# Patient Record
Sex: Female | Born: 1985 | Race: White | Hispanic: No | Marital: Married | Smoking: Former smoker
Health system: Southern US, Community
[De-identification: ages and names within clinical notes are randomized; demographics above are authoritative.]

## PROBLEM LIST (undated history)

## (undated) ENCOUNTER — Inpatient Hospital Stay (HOSPITAL_COMMUNITY): Payer: Self-pay

## (undated) DIAGNOSIS — Z9851 Tubal ligation status: Secondary | ICD-10-CM

## (undated) DIAGNOSIS — D62 Acute posthemorrhagic anemia: Secondary | ICD-10-CM

## (undated) DIAGNOSIS — O4400 Placenta previa specified as without hemorrhage, unspecified trimester: Secondary | ICD-10-CM

## (undated) HISTORY — PX: MUSCLE REPAIR: SHX867

---
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
## 2006-03-26 ENCOUNTER — Emergency Department (HOSPITAL_COMMUNITY): Admission: EM | Admit: 2006-03-26 | Discharge: 2006-03-26 | Payer: Self-pay | Admitting: Emergency Medicine

## 2007-06-01 IMAGING — CT CT ABDOMEN W/ CM
2 of 5 series · 13 of 32 positions shown, 18 images · IV contrast (ORAL OMNI 350 & 80 ml omni 300)
Comparison: None.

CLINICAL DATA: Generalized abdominal pain with nausea, vomiting and diarrhea since 7 PM last night. Rule out food poisoning.
ABDOMEN CT WITH CONTRAST:
TECHNIQUE: Multidetector CT imaging of the abdomen was performed following the standard protocol during bolus administration of intravenous contrast.
Contrast:  80 cc Omnipaque 300
TECHNIQUE: Multidetector CT imaging of the pelvis was performed following the standard protocol during bolus administration of intravenous contrast.

[Series 2: routine abdomen · axial · 0.66mm/px · z∈[-392,-112]mm · 5 of 86 slices shown, 10 images]
[im 15/86  soft-tissue]
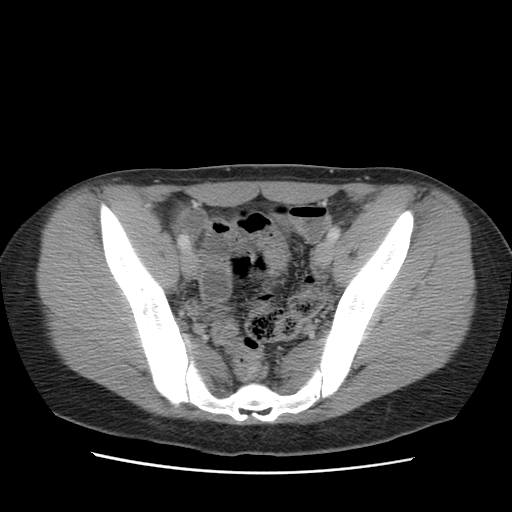
[im 15/86  bone]
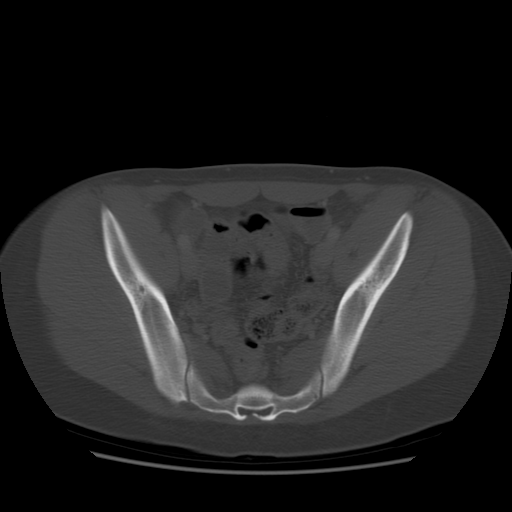
[im 29/86  soft-tissue]
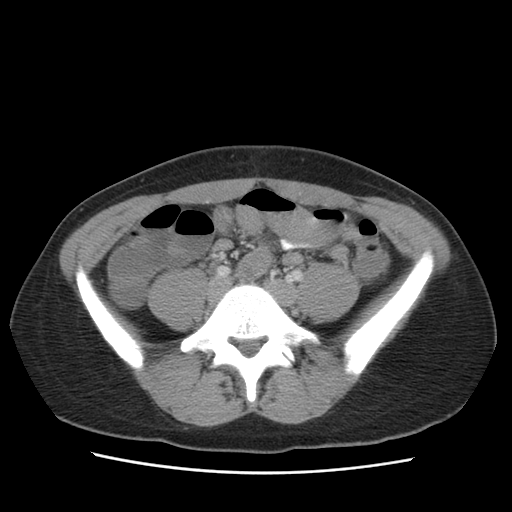
[im 29/86  lung]
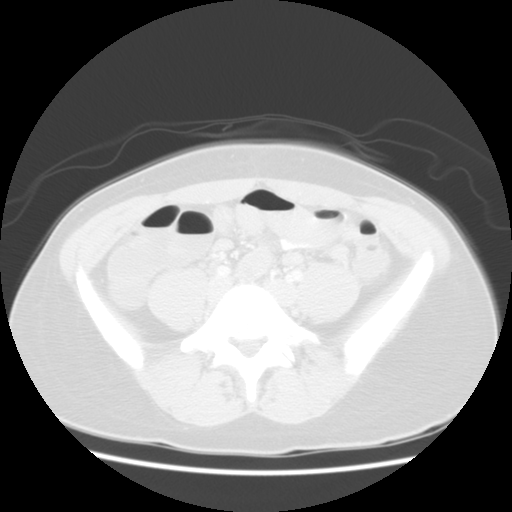
[im 43/86  soft-tissue]
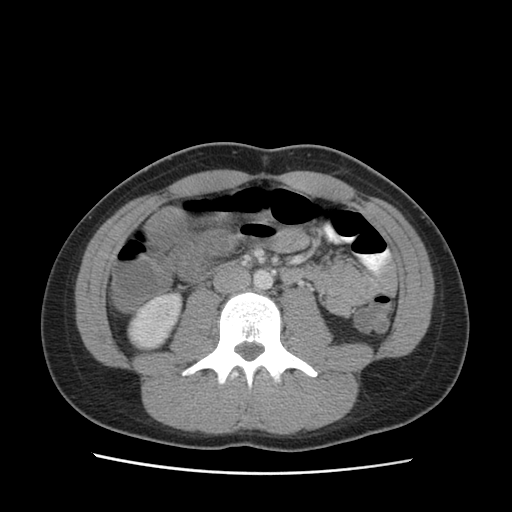
[im 43/86  lung]
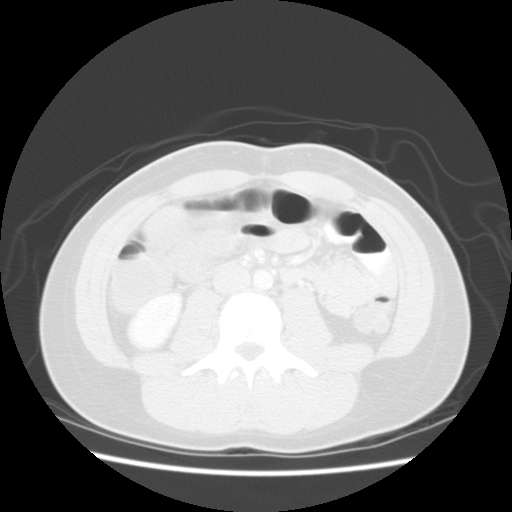
[im 57/86  soft-tissue]
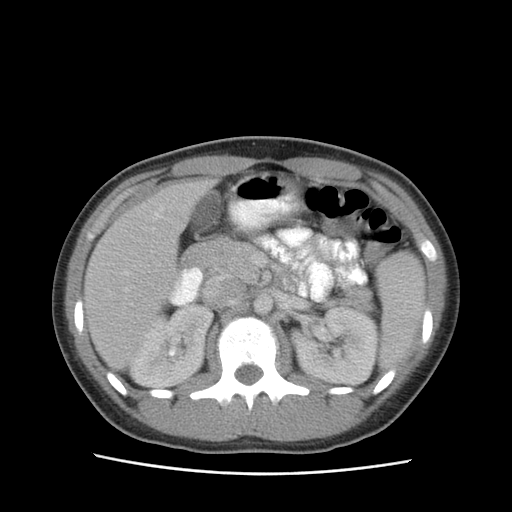
[im 57/86  lung]
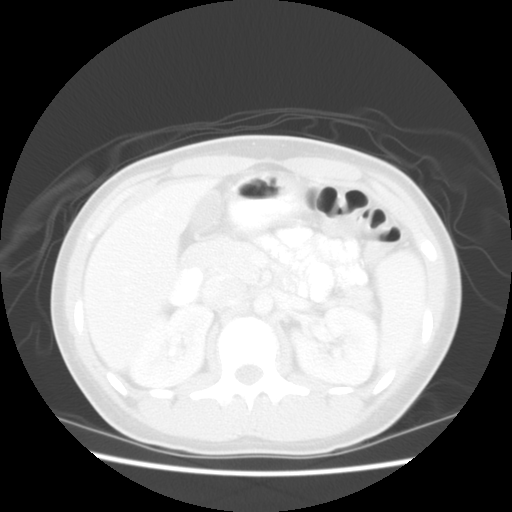
[im 71/86  soft-tissue]
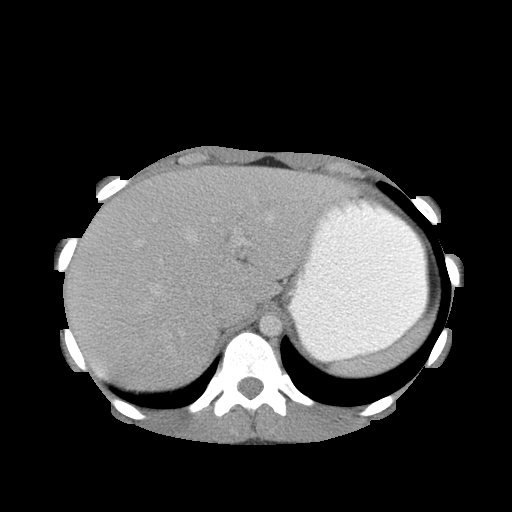
[im 71/86  lung]
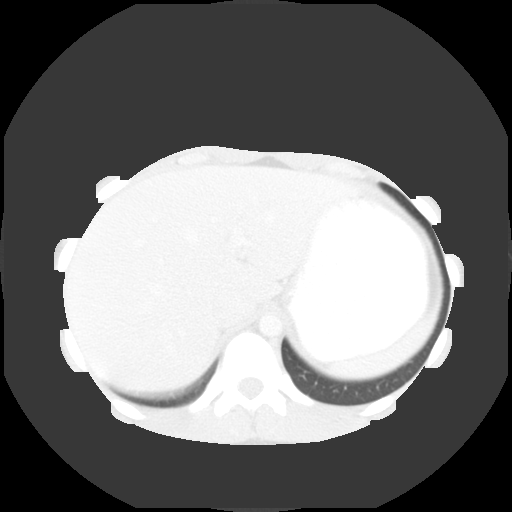

[Series 400: reformatted · sagittal · 0.94mm/px · 8 of 127 slices shown]
[im 13/127  soft-tissue]
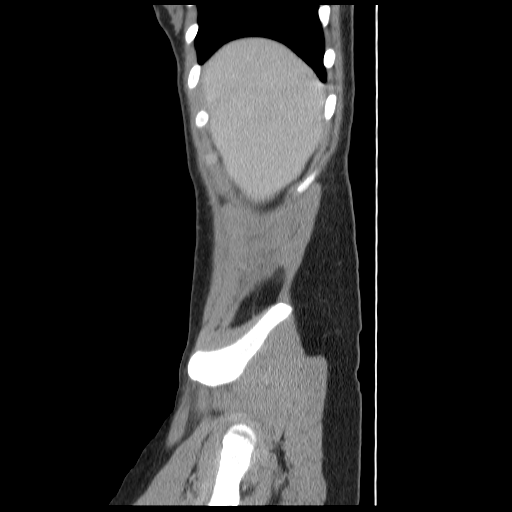
[im 26/127  soft-tissue]
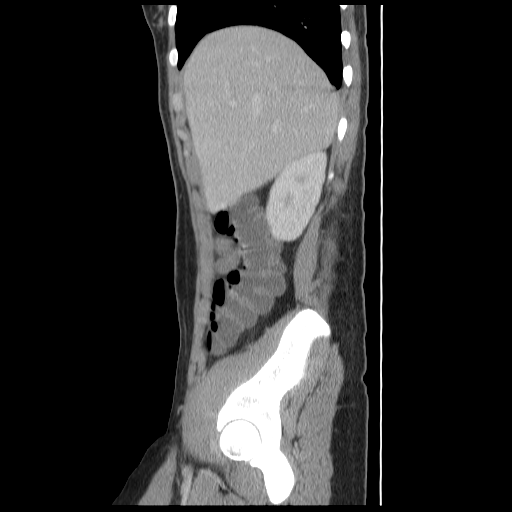
[im 38/127  soft-tissue]
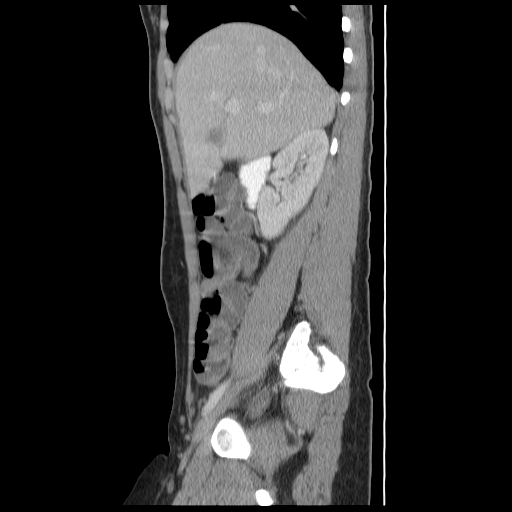
[im 51/127  soft-tissue]
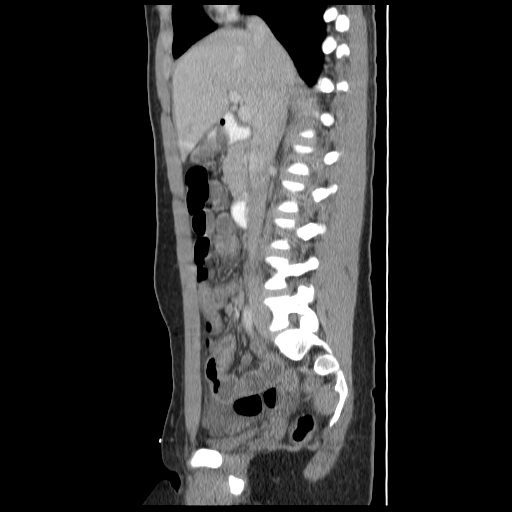
[im 76/127  soft-tissue]
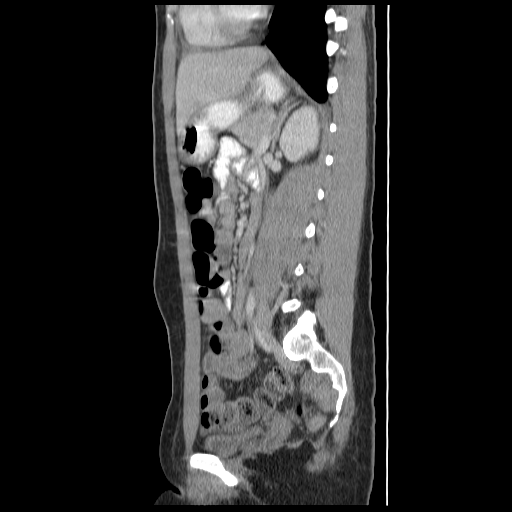
[im 89/127  soft-tissue]
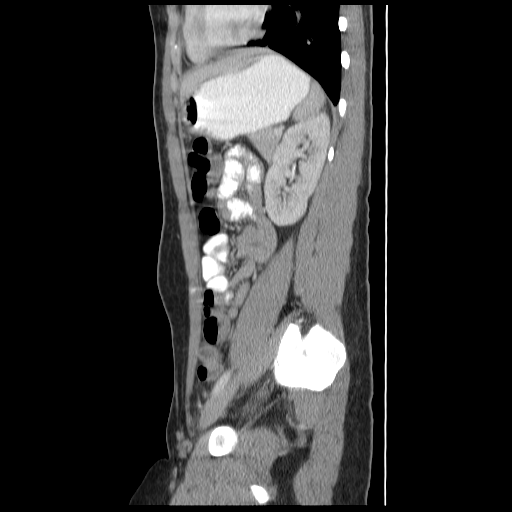
[im 101/127  soft-tissue]
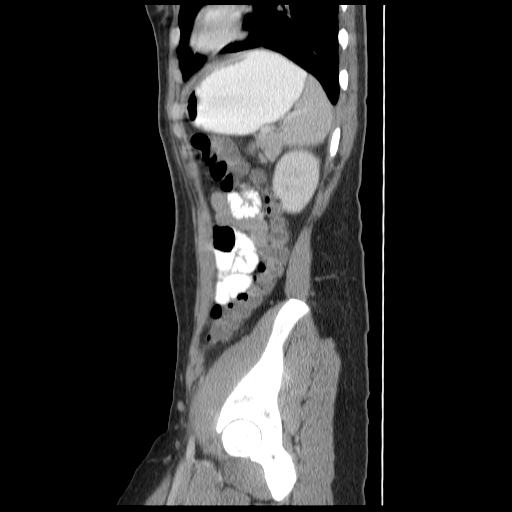
[im 114/127  soft-tissue]
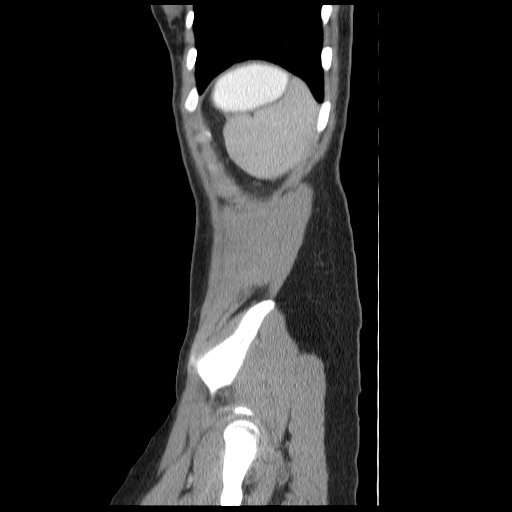

[13 of 32 positions shown; findings below may reference images not displayed]

FINDINGS: Lung bases are clear.  Heart size is normal.  There is no pericardial or pleural effusion. 
The liver, spleen, stomach, pancreas, gallbladder, adrenal glands, and kidneys are normal.  There is no retroperitoneal or retrocrural adenopathy.  
There is poor opacification of distal small bowel loops in the colon.  There is, however, an area of apparent ileal wall thickening on image 45 through 51 with surrounding mild edema. The appendix is difficult to confidently localize.  It is felt to be positioned in the right hemipelvis on images 67 through 72.  There is air within the appendix. There is no convincing evidence of appendicitis. No obstruction.
IMPRESSION: 1.  An area of mild ileal wall thickening superimposed upon under distention is suspicious for ileitis.  This is suboptimally evaluated due to poor opacification in this area.  Differential considerations would include most likely an infectious ileitis with an inflammatory ileitis felt less likely.  The terminal ileum is within normal limits.
2.  Suboptimal evaluation of the appendix without convincing evidence of appendicitis.  Consideration of repeat imaging after further enteric contrast opacification of the distal small bowel is recommended.
PELVIS CT WITH CONTRAST:
FINDINGS: Please see discussion above of the appendix.  There is a small amount of free fluid.  Pelvic small bowel is normal. There is no pelvic adenopathy. The urinary bladder, uterus are normal for age. Bone windows demonstrate no focal osseous lesion.
IMPRESSION: Small amount of free fluid within the pelvis.

## 2012-07-17 NOTE — L&D Delivery Note (Signed)
Delivery Note At 4:50 PM a healthy female was delivered via  (Presentation: Occiput left ant ).  APGAR: 9, 9 ; weight pending.   Placenta status: complete , 3 Vs .  Cord:  with the following complications: none .  Cord pH: N/A  Anesthesia:  epidural Episiotomy: none Lacerations: 2nd degree perineal and right labia minora Suture Repair: 2.0 3.0 vicryl vicryl rapide 4-0 Est. Blood Loss (mL): 300  Mom to postpartum.  Baby to nursery-stable.  Shloma Roggenkamp,MARIE-LYNE 04/22/2013, 5:22 PM

## 2012-09-23 LAB — OB RESULTS CONSOLE ANTIBODY SCREEN: Antibody Screen: NEGATIVE

## 2012-09-23 LAB — OB RESULTS CONSOLE GBS: GBS: POSITIVE

## 2012-09-23 LAB — OB RESULTS CONSOLE RPR: RPR: NONREACTIVE

## 2012-09-24 LAB — OB RESULTS CONSOLE GC/CHLAMYDIA
Chlamydia: NEGATIVE
Gonorrhea: NEGATIVE

## 2013-04-22 ENCOUNTER — Inpatient Hospital Stay (HOSPITAL_COMMUNITY)
Admission: AD | Admit: 2013-04-22 | Discharge: 2013-04-24 | DRG: 373 | Disposition: A | Discharge status: Home / Self Care | Payer: BC Managed Care – PPO | Source: Ambulatory Visit | Attending: Obstetrics & Gynecology | Admitting: Obstetrics & Gynecology

## 2013-04-22 ENCOUNTER — Encounter (HOSPITAL_COMMUNITY): Payer: Self-pay | Admitting: Anesthesiology

## 2013-04-22 ENCOUNTER — Inpatient Hospital Stay (HOSPITAL_COMMUNITY): Payer: BC Managed Care – PPO | Admitting: Anesthesiology

## 2013-04-22 ENCOUNTER — Encounter (HOSPITAL_COMMUNITY): Payer: Self-pay | Admitting: *Deleted

## 2013-04-22 DIAGNOSIS — O99892 Other specified diseases and conditions complicating childbirth: Principal | ICD-10-CM | POA: Diagnosis present

## 2013-04-22 DIAGNOSIS — Z22330 Carrier of Group B streptococcus: Secondary | ICD-10-CM

## 2013-04-22 LAB — CBC
MCHC: 35.2 g/dL (ref 30.0–36.0)
MCV: 85.9 fL (ref 78.0–100.0)
Platelets: 214 10*3/uL (ref 150–400)
RBC: 3.84 MIL/uL — ABNORMAL LOW (ref 3.87–5.11)
RDW: 13 % (ref 11.5–15.5)
WBC: 10 10*3/uL (ref 4.0–10.5)

## 2013-04-22 LAB — RPR: RPR Ser Ql: NONREACTIVE

## 2013-04-22 MED ORDER — CITRIC ACID-SODIUM CITRATE 334-500 MG/5ML PO SOLN: 30.0000 mL | ORAL | Status: DC | PRN

## 2013-04-22 MED ORDER — DIBUCAINE 1 % RE OINT: 1.0000 "application " | TOPICAL_OINTMENT | RECTAL | Status: DC | PRN

## 2013-04-22 MED ORDER — EPHEDRINE 5 MG/ML INJ
10.0000 mg | INTRAVENOUS | Status: DC | PRN
  Filled 2013-04-22: qty 2
  Filled 2013-04-22: qty 4

## 2013-04-22 MED ORDER — EPHEDRINE 5 MG/ML INJ
10.0000 mg | INTRAVENOUS | Status: DC | PRN
  Filled 2013-04-22: qty 2

## 2013-04-22 MED ORDER — IBUPROFEN 600 MG PO TABS: 600.0000 mg | ORAL_TABLET | Freq: Four times a day (QID) | ORAL | Status: DC | PRN

## 2013-04-22 MED ORDER — ACETAMINOPHEN 325 MG PO TABS
650.0000 mg | ORAL_TABLET | ORAL | Status: DC | PRN
  Administered 2013-04-22: 650 mg via ORAL
  Filled 2013-04-22: qty 2

## 2013-04-22 MED ORDER — LACTATED RINGERS IV SOLN
INTRAVENOUS | Status: DC
  Administered 2013-04-22: 06:00:00 via INTRAVENOUS

## 2013-04-22 MED ORDER — BUTORPHANOL TARTRATE 1 MG/ML IJ SOLN: 1.0000 mg | INTRAMUSCULAR | Status: DC | PRN

## 2013-04-22 MED ORDER — ONDANSETRON HCL 4 MG/2ML IJ SOLN
4.0000 mg | Freq: Four times a day (QID) | INTRAMUSCULAR | Status: DC | PRN
  Administered 2013-04-22: 4 mg via INTRAVENOUS
  Filled 2013-04-22: qty 2

## 2013-04-22 MED ORDER — OXYTOCIN 40 UNITS IN LACTATED RINGERS INFUSION - SIMPLE MED
1.0000 m[IU]/min | INTRAVENOUS | Status: DC
  Administered 2013-04-22: 6 m[IU]/min via INTRAVENOUS
  Administered 2013-04-22: 2 m[IU]/min via INTRAVENOUS

## 2013-04-22 MED ORDER — OXYTOCIN 40 UNITS IN LACTATED RINGERS INFUSION - SIMPLE MED: 62.5000 mL/h | INTRAVENOUS | Status: DC | PRN

## 2013-04-22 MED ORDER — LIDOCAINE HCL (PF) 1 % IJ SOLN
INTRAMUSCULAR | Status: DC | PRN
  Administered 2013-04-22 (×2): 5 mL

## 2013-04-22 MED ORDER — OXYCODONE-ACETAMINOPHEN 5-325 MG PO TABS: 1.0000 | ORAL_TABLET | ORAL | Status: DC | PRN

## 2013-04-22 MED ORDER — OXYCODONE-ACETAMINOPHEN 5-325 MG PO TABS
1.0000 | ORAL_TABLET | ORAL | Status: DC | PRN
  Administered 2013-04-22 – 2013-04-24 (×3): 1 via ORAL
  Filled 2013-04-22 (×3): qty 1

## 2013-04-22 MED ORDER — CITRIC ACID-SODIUM CITRATE 334-500 MG/5ML PO SOLN
30.0000 mL | ORAL | Status: DC | PRN
  Administered 2013-04-22: 30 mL via ORAL
  Filled 2013-04-22: qty 15

## 2013-04-22 MED ORDER — WITCH HAZEL-GLYCERIN EX PADS: 1.0000 "application " | MEDICATED_PAD | CUTANEOUS | Status: DC | PRN

## 2013-04-22 MED ORDER — OXYTOCIN 40 UNITS IN LACTATED RINGERS INFUSION - SIMPLE MED
62.5000 mL/h | INTRAVENOUS | Status: DC
  Administered 2013-04-22: 62.5 mL/h via INTRAVENOUS
  Filled 2013-04-22: qty 1000

## 2013-04-22 MED ORDER — PRENATAL MULTIVITAMIN CH
1.0000 | ORAL_TABLET | Freq: Every day | ORAL | Status: DC
  Administered 2013-04-23: 1 via ORAL

## 2013-04-22 MED ORDER — PHENYLEPHRINE 40 MCG/ML (10ML) SYRINGE FOR IV PUSH (FOR BLOOD PRESSURE SUPPORT)
80.0000 ug | PREFILLED_SYRINGE | INTRAVENOUS | Status: DC | PRN
  Filled 2013-04-22: qty 5
  Filled 2013-04-22: qty 2

## 2013-04-22 MED ORDER — TERBUTALINE SULFATE 1 MG/ML IJ SOLN: 0.2500 mg | Freq: Once | INTRAMUSCULAR | Status: DC | PRN

## 2013-04-22 MED ORDER — ONDANSETRON HCL 4 MG/2ML IJ SOLN: 4.0000 mg | INTRAMUSCULAR | Status: DC | PRN

## 2013-04-22 MED ORDER — IBUPROFEN 600 MG PO TABS
600.0000 mg | ORAL_TABLET | Freq: Four times a day (QID) | ORAL | Status: DC
  Administered 2013-04-23 – 2013-04-24 (×6): 600 mg via ORAL
  Filled 2013-04-22 (×6): qty 1

## 2013-04-22 MED ORDER — LANOLIN HYDROUS EX OINT: TOPICAL_OINTMENT | CUTANEOUS | Status: DC | PRN

## 2013-04-22 MED ORDER — PNEUMOCOCCAL VAC POLYVALENT 25 MCG/0.5ML IJ INJ
0.5000 mL | INJECTION | INTRAMUSCULAR | Status: AC
  Administered 2013-04-24: 0.5 mL via INTRAMUSCULAR
  Filled 2013-04-22: qty 0.5

## 2013-04-22 MED ORDER — PENICILLIN G POTASSIUM 5000000 UNITS IJ SOLR
5.0000 10*6.[IU] | Freq: Once | INTRAVENOUS | Status: AC
  Administered 2013-04-22: 5 10*6.[IU] via INTRAVENOUS
  Filled 2013-04-22: qty 5

## 2013-04-22 MED ORDER — OXYTOCIN BOLUS FROM INFUSION: 500.0000 mL | INTRAVENOUS | Status: DC

## 2013-04-22 MED ORDER — TETANUS-DIPHTH-ACELL PERTUSSIS 5-2.5-18.5 LF-MCG/0.5 IM SUSP: 0.5000 mL | Freq: Once | INTRAMUSCULAR | Status: DC

## 2013-04-22 MED ORDER — ACETAMINOPHEN 325 MG PO TABS: 650.0000 mg | ORAL_TABLET | ORAL | Status: DC | PRN

## 2013-04-22 MED ORDER — DIPHENHYDRAMINE HCL 25 MG PO CAPS: 25.0000 mg | ORAL_CAPSULE | Freq: Four times a day (QID) | ORAL | Status: DC | PRN

## 2013-04-22 MED ORDER — FLEET ENEMA 7-19 GM/118ML RE ENEM: 1.0000 | ENEMA | RECTAL | Status: DC | PRN

## 2013-04-22 MED ORDER — PENICILLIN G POTASSIUM 5000000 UNITS IJ SOLR: 5.0000 10*6.[IU] | Freq: Once | INTRAVENOUS | Status: DC

## 2013-04-22 MED ORDER — LACTATED RINGERS IV SOLN
500.0000 mL | Freq: Once | INTRAVENOUS | Status: AC
  Administered 2013-04-22: 500 mL via INTRAVENOUS

## 2013-04-22 MED ORDER — LACTATED RINGERS IV SOLN: 500.0000 mL | INTRAVENOUS | Status: DC | PRN

## 2013-04-22 MED ORDER — ONDANSETRON HCL 4 MG/2ML IJ SOLN: 4.0000 mg | Freq: Four times a day (QID) | INTRAMUSCULAR | Status: DC | PRN

## 2013-04-22 MED ORDER — ONDANSETRON HCL 4 MG PO TABS: 4.0000 mg | ORAL_TABLET | ORAL | Status: DC | PRN

## 2013-04-22 MED ORDER — PHENYLEPHRINE 40 MCG/ML (10ML) SYRINGE FOR IV PUSH (FOR BLOOD PRESSURE SUPPORT)
80.0000 ug | PREFILLED_SYRINGE | INTRAVENOUS | Status: DC | PRN
  Filled 2013-04-22: qty 2

## 2013-04-22 MED ORDER — BENZOCAINE-MENTHOL 20-0.5 % EX AERO
1.0000 "application " | INHALATION_SPRAY | CUTANEOUS | Status: DC | PRN
  Filled 2013-04-22: qty 56

## 2013-04-22 MED ORDER — LIDOCAINE HCL (PF) 1 % IJ SOLN: 30.0000 mL | INTRAMUSCULAR | Status: DC | PRN

## 2013-04-22 MED ORDER — LACTATED RINGERS IV SOLN
INTRAVENOUS | Status: DC
  Administered 2013-04-22: 03:00:00 via INTRAVENOUS

## 2013-04-22 MED ORDER — FENTANYL 2.5 MCG/ML BUPIVACAINE 1/10 % EPIDURAL INFUSION (WH - ANES)
14.0000 mL/h | INTRAMUSCULAR | Status: DC | PRN
  Administered 2013-04-22 (×2): 14 mL/h via EPIDURAL
  Filled 2013-04-22 (×2): qty 125

## 2013-04-22 MED ORDER — IBUPROFEN 600 MG PO TABS
600.0000 mg | ORAL_TABLET | Freq: Four times a day (QID) | ORAL | Status: DC | PRN
  Administered 2013-04-22: 600 mg via ORAL
  Filled 2013-04-22: qty 1

## 2013-04-22 MED ORDER — DIPHENHYDRAMINE HCL 50 MG/ML IJ SOLN: 12.5000 mg | INTRAMUSCULAR | Status: DC | PRN

## 2013-04-22 MED ORDER — PENICILLIN G POTASSIUM 5000000 UNITS IJ SOLR: 2.5000 10*6.[IU] | INTRAVENOUS | Status: DC

## 2013-04-22 MED ORDER — OXYTOCIN 40 UNITS IN LACTATED RINGERS INFUSION - SIMPLE MED: 62.5000 mL/h | INTRAVENOUS | Status: DC

## 2013-04-22 MED ORDER — SIMETHICONE 80 MG PO CHEW: 80.0000 mg | CHEWABLE_TABLET | ORAL | Status: DC | PRN

## 2013-04-22 MED ORDER — ZOLPIDEM TARTRATE 5 MG PO TABS: 5.0000 mg | ORAL_TABLET | Freq: Every evening | ORAL | Status: DC | PRN

## 2013-04-22 MED ORDER — PENICILLIN G POTASSIUM 5000000 UNITS IJ SOLR
2.5000 10*6.[IU] | INTRAVENOUS | Status: DC
  Administered 2013-04-22 (×2): 2.5 10*6.[IU] via INTRAVENOUS
  Filled 2013-04-22 (×6): qty 2.5

## 2013-04-22 MED ORDER — LIDOCAINE HCL (PF) 1 % IJ SOLN
30.0000 mL | INTRAMUSCULAR | Status: DC | PRN
  Administered 2013-04-22: 30 mL via SUBCUTANEOUS
  Filled 2013-04-22 (×2): qty 30

## 2013-04-22 MED ORDER — SENNOSIDES-DOCUSATE SODIUM 8.6-50 MG PO TABS
2.0000 | ORAL_TABLET | ORAL | Status: DC
  Administered 2013-04-23 – 2013-04-24 (×2): 2 via ORAL
  Filled 2013-04-22: qty 2

## 2013-04-22 NOTE — Progress Notes (Signed)
Delivery of live viable female by Dr Seymour Bars. APGARs 8,9

## 2013-04-22 NOTE — H&P (Signed)
Lillyann Ahart is a 27 y.o. female presenting at 40.4 wks in active labor., UCs+ no leaking/ bleeding. Good FMs. GBS(+), needs PCN in labor.  Maternal Medical History:  Reason for admission: Contractions.    [facility], uncomplicated, good wt gain, nl labs, GBS(+) urine in 1st trim, anatomy sono nl, low placenta resolved in 3rd trim. Took Flu and TDAp vaccines. Quit smoking in 1st trimester.   OB History   Grav Para Term Preterm Abortions TAB SAB Ect Mult Living   1              Past Medical History  Diagnosis Date  . Medical history non-contributory    Past Surgical History  Procedure Laterality Date  . Muscle repair      right groin   Family History: family history includes Cancer in her maternal grandmother, mother, paternal aunt, and paternal grandmother. Social History:  reports that she has been smoking Cigarettes.  She has a 1.5 pack-year smoking history. She does not have any smokeless tobacco history on file. She reports that she does not drink alcohol or use illicit drugs.   Prenatal Transfer Tool  Maternal Diabetes: No Genetic Screening: Normal Ultrascreen neg, AFP1 neg Maternal Ultrasounds/Referrals: Normal Fetal Ultrasounds or other Referrals:  None Maternal Substance Abuse:  No Significant Maternal Medications:  None Significant Maternal Lab Results:  Lab values include: Group B Strep positive Other Comments:  None  ROS neg   Dilation: 4 Effacement (%): 80;70 Station: -2 Exam by:: Robbie Lis, RN Blood pressure 121/68, pulse 89, temperature 97.9 F (36.6 C), temperature source Oral, resp. rate 20, height 5\' 5"  (1.651 m), weight 188 lb (85.276 kg). Exam Physical Exam   A&O x 3, no acute distress. Pleasant HEENT neg, no thyromegaly Lungs CTA bilat CV RRR, S1S2 normal Abdo soft, non tender, non acute Extr no edema/ tenderness Pelvic as above per RN FHT 135-140/ + accels/ no decels/ mod variab- reassuring category I Toco  q 3  min  Prenatal labs: ABO: A(+) Antibody: neg  Rubella:Immune RPR: Neg x 2 HBsAg:  neg HIV: neg  GBS: (+) in urine in 1st trimester Anatomy sono normal  Assessment/Plan: 27 yo, G1 at 40.4 wks in active labor. Start PCN prophylaxis, pain mngmt options reviewed. FHT category I. Monitor progress.    Tamila Gaulin R 04/22/2013, 3:29 AM

## 2013-04-22 NOTE — Progress Notes (Signed)
[name] is a 27 y.o. G1P0 at [redacted]w[redacted]d active labor SROM, clear fluid, epidural working well. FHT category I Per RN  SVE:   Dilation: 6.5 Effacement (%): 80 Station: -1 Exam by:: Teresa Danas RN  Assessment / Plan: Active labor but UCs spaced out, hence labor augmentation with pitocin Labor: Progressing normally Fetal Wellbeing:  Category I Pain Control:  Epidural I/D:  GBS(+) on PCN  Anticipated MOD:  NSVD  [name] R 04/22/2013, 1:27 PM

## 2013-04-22 NOTE — Anesthesia Procedure Notes (Signed)
Epidural Patient location during procedure: OB Start time: 04/22/2013 5:10 AM  Staffing Anesthesiologist: Angus Seller., Harrell Gave. Performed by: anesthesiologist   Preanesthetic Checklist Completed: patient identified, site marked, surgical consent, pre-op evaluation, timeout performed, IV checked, risks and benefits discussed and monitors and equipment checked  Epidural Patient position: sitting Prep: site prepped and draped and DuraPrep Patient monitoring: continuous pulse ox and blood pressure Approach: midline Injection technique: LOR air and LOR saline  Needle:  Needle type: Tuohy  Needle gauge: 17 G Needle length: 9 cm and 9 Needle insertion depth: 5 cm cm Catheter type: closed end flexible Catheter size: 19 Gauge Catheter at skin depth: 10 cm Test dose: negative  Assessment Events: blood not aspirated, injection not painful, no injection resistance, negative IV test and no paresthesia  Additional Notes Patient identified.  Risk benefits discussed including failed block, incomplete pain control, headache, nerve damage, paralysis, blood pressure changes, nausea, vomiting, reactions to medication both toxic or allergic, and postpartum back pain.  Patient expressed understanding and wished to proceed.  All questions were answered.  Sterile technique used throughout procedure and epidural site dressed with sterile barrier dressing. No paresthesia or other complications noted.The patient did not experience any signs of intravascular injection such as tinnitus or metallic taste in mouth nor signs of intrathecal spread such as rapid motor block. Please see nursing notes for vital signs.

## 2013-04-22 NOTE — Anesthesia Preprocedure Evaluation (Signed)

## 2013-04-22 NOTE — MAU Note (Signed)
PT  SAYS HURT  BAD  AT 0030 WITH UC-.   VE IN OFFICE ON 10-2     2-3 CM.    DENIES HSV AND MRSA.

## 2013-04-23 ENCOUNTER — Encounter (HOSPITAL_COMMUNITY): Payer: Self-pay | Admitting: Obstetrics and Gynecology

## 2013-04-23 LAB — CBC
HCT: 29.3 % — ABNORMAL LOW (ref 36.0–46.0)
MCH: 31.3 pg (ref 26.0–34.0)
MCHC: 35.8 g/dL (ref 30.0–36.0)
MCV: 87.5 fL (ref 78.0–100.0)
Platelets: 182 10*3/uL (ref 150–400)
RDW: 13 % (ref 11.5–15.5)
WBC: 16.3 10*3/uL — ABNORMAL HIGH (ref 4.0–10.5)

## 2013-04-23 NOTE — Anesthesia Postprocedure Evaluation (Signed)
Anesthesia Post Note  Patient: [name]  Procedure(s) Performed: * No procedures listed *  Anesthesia type: Epidural  Patient location: Mother/Baby  Post pain: Pain level controlled  Post assessment: Post-op Vital signs reviewed  Last Vitals:  Filed Vitals:   04/23/13 0534  BP: 100/62  Pulse: 62  Temp: 36.5 C  Resp: 18    Post vital signs: Reviewed  Level of consciousness:alert  Complications: No apparent anesthesia complications

## 2013-04-23 NOTE — Progress Notes (Signed)
Patient ID: [name], female   DOB: 04/13/1986, 27 y.o.   MRN: [id number] PPD # 1 SVD  S:  Reports feeling well             Tolerating po/ No nausea or vomiting             Bleeding is light             Pain controlled with ibuprofen (OTC)             Up ad lib / ambulatory / voiding without difficulties.   Newborn  Information for the patient's newborn:  [name], [name] [[id number]]  female  breast feeding - well   O:  A & O x 3, in no apparent distress, very pleasant             VS:  Filed Vitals:   04/22/13 1857 04/22/13 2000 04/22/13 2315 04/23/13 0534  BP: 108/62 133/77 104/55 100/62  Pulse: 54 58 63 62  Temp: 98.1 F (36.7 C) 97.7 F (36.5 C) 98.1 F (36.7 C) 97.7 F (36.5 C)  TempSrc: Oral Oral Oral   Resp: 16 18 18 18   Height:      Weight:      SpO2:  99% 99%     LABS:  Recent Labs  04/22/13 0315 04/23/13 0600  WBC 10.0 16.3*  HGB 11.6* 10.5*  HCT 33.0* 29.3*  PLT 214 182    Blood type: A/Positive/-- (03/10 0000)  Rubella: Immune (03/10 0000)     Lungs: Clear and unlabored  Heart: regular rate and rhythm / no murmurs  Abdomen: soft, non-tender, non-distended, normal bowel sounds             Fundus: firm, non-tender, @ U  Perineum: 2nd degree repair healing well, edema present - ice pack in place  Lochia: light  Extremities: no edema, no calf pain or tenderness, no Homans    A/P: PPD # 1  27 y.o., G1P0   Principal Problem:   Postpartum care following vaginal delivery (10/7) Active Problems:   Normal labor and delivery   Doing well - stable status  Routine post partum orders  Anticipate discharge tomorrow    Raelyn Mora, M, MSN, CNM 04/23/2013, 9:16 AM

## 2013-04-24 ENCOUNTER — Encounter (HOSPITAL_COMMUNITY): Payer: Self-pay | Admitting: *Deleted

## 2013-04-24 MED ORDER — OXYCODONE-ACETAMINOPHEN 5-325 MG PO TABS: 1.0000 | ORAL_TABLET | Freq: Four times a day (QID) | ORAL | Status: DC | PRN

## 2013-04-24 MED ORDER — SENNOSIDES-DOCUSATE SODIUM 8.6-50 MG PO TABS: 2.0000 | ORAL_TABLET | ORAL | Status: DC

## 2013-04-24 MED ORDER — IBUPROFEN 600 MG PO TABS: 600.0000 mg | ORAL_TABLET | Freq: Four times a day (QID) | ORAL | Status: DC | PRN

## 2013-04-24 NOTE — Progress Notes (Signed)
Patient ID: [name], female   DOB: 04-11-1986, 27 y.o.   MRN: [id number] PPD # 2 S/P SVD w/2nd deg lac  Subjective: Pt reports feeling well and eager for d/c home / Pain controlled with ibuprofen and percocet Tolerating po/ Voiding without problems/ No n/v Bleeding is moderate/ Newborn info:  Information for the patient's newborn:  Teresa, Melton [[id number]]  female Feeding: breast    Objective:  VS: Blood pressure 101/62, pulse 54, temperature 98 F (36.7 C), temperature source Oral, resp. rate 18.    Recent Labs  04/22/13 0315 04/23/13 0600  WBC 10.0 16.3*  HGB 11.6* 10.5*  HCT 33.0* 29.3*  PLT 214 182    Blood type: A/Positive Rubella: Immune    Physical Exam:  General:  alert, cooperative and no distress CV: Regular rate and rhythm Resp: clear Abdomen: soft, nontender, normal bowel sounds Uterine Fundus: firm, below umbilicus, nontender Perineum: not inspected Lochia: minimal Ext: edema trace and Homans sign is negative, no sign of DVT    A/P: PPD # 2/ G1P1001/ S/P: SVD w/2nd deg lac Doing well and stable for discharge home RX: Ibuprofen 600mg  po Q 6 hrs prn pain #30 Refill x 1 Percocet 5/325 1 to 2 po Q 4 hrs prn pain #15 No refill Senokot po daily prn constipation #30 ref x 1 WOB/GYN booklet given Routine pp visit in 6wks   Demetrius Revel, MSN, [facility] 04/24/2013, 10:02 AM

## 2013-04-24 NOTE — Discharge Summary (Signed)
Obstetric Discharge Summary Reason for Admission: G1 P0 @ 40wks 4days in active labor.  GBS positive; PCN per protocol Prenatal Procedures: NST and ultrasound Intrapartum Procedures: spontaneous vaginal delivery Postpartum Procedures: none Complications-Operative and Postpartum: 2nd degree perineal laceration Hemoglobin  Date Value Range Status  04/23/2013 10.5* 12.0 - 15.0 g/dL Final     HCT  Date Value Range Status  04/23/2013 29.3* 36.0 - 46.0 % Final    Physical Exam:  General: alert, cooperative and no distress Lochia: appropriate Uterine Fundus: firm Incision: n/a DVT Evaluation: No evidence of DVT seen on physical exam. Negative Homan's sign.  Discharge Diagnoses: G1 P1 s/p SVD with 2nd deg lac  Discharge Information: Date: 04/24/2013 Activity: pelvic rest Diet: routine Medications: PNV, Ibuprofen, Colace and Percocet Condition: stable Instructions: refer to practice specific booklet Discharge to: home Follow-up Information   Follow up with MODY,VAISHALI R, MD In 6 weeks.   Specialty:  Obstetrics and Gynecology   Contact information:   [address] [place] [address] [phone]       Newborn Data: Live born female on 04/22/13 Birth Weight: 8 lb 3 oz (3714 g) APGAR: 8, 9  Home with mother.  Kelliann Pendergraph K 04/24/2013, 10:28 AM

## 2013-11-19 LAB — OB RESULTS CONSOLE RPR: RPR: NONREACTIVE

## 2013-11-19 LAB — OB RESULTS CONSOLE ABO/RH: RH Type: POSITIVE

## 2013-11-19 LAB — OB RESULTS CONSOLE HIV ANTIBODY (ROUTINE TESTING): HIV: NONREACTIVE

## 2013-11-19 LAB — OB RESULTS CONSOLE GC/CHLAMYDIA
Chlamydia: NEGATIVE
Gonorrhea: NEGATIVE

## 2013-11-19 LAB — OB RESULTS CONSOLE RUBELLA ANTIBODY, IGM: RUBELLA: IMMUNE

## 2013-11-19 LAB — OB RESULTS CONSOLE HEPATITIS B SURFACE ANTIGEN: Hepatitis B Surface Ag: NEGATIVE

## 2014-03-02 ENCOUNTER — Inpatient Hospital Stay (HOSPITAL_COMMUNITY): Payer: BC Managed Care – PPO

## 2014-03-02 ENCOUNTER — Inpatient Hospital Stay (HOSPITAL_COMMUNITY)
Admission: AD | Admit: 2014-03-02 | Discharge: 2014-03-07 | DRG: 781 | Disposition: A | Discharge status: Home / Self Care | Payer: BC Managed Care – PPO | Source: Ambulatory Visit | Attending: Obstetrics & Gynecology | Admitting: Obstetrics & Gynecology

## 2014-03-02 ENCOUNTER — Encounter (HOSPITAL_COMMUNITY): Payer: Self-pay | Admitting: *Deleted

## 2014-03-02 DIAGNOSIS — O4413 Placenta previa with hemorrhage, third trimester: Secondary | ICD-10-CM | POA: Diagnosis present

## 2014-03-02 DIAGNOSIS — O99891 Other specified diseases and conditions complicating pregnancy: Secondary | ICD-10-CM | POA: Diagnosis present

## 2014-03-02 DIAGNOSIS — Z22330 Carrier of Group B streptococcus: Secondary | ICD-10-CM

## 2014-03-02 DIAGNOSIS — O99019 Anemia complicating pregnancy, unspecified trimester: Secondary | ICD-10-CM | POA: Diagnosis present

## 2014-03-02 DIAGNOSIS — D649 Anemia, unspecified: Secondary | ICD-10-CM | POA: Diagnosis present

## 2014-03-02 DIAGNOSIS — O4690 Antepartum hemorrhage, unspecified, unspecified trimester: Secondary | ICD-10-CM | POA: Diagnosis present

## 2014-03-02 DIAGNOSIS — O9989 Other specified diseases and conditions complicating pregnancy, childbirth and the puerperium: Secondary | ICD-10-CM

## 2014-03-02 DIAGNOSIS — O4410 Placenta previa with hemorrhage, unspecified trimester: Secondary | ICD-10-CM | POA: Diagnosis present

## 2014-03-02 DIAGNOSIS — O99330 Smoking (tobacco) complicating pregnancy, unspecified trimester: Secondary | ICD-10-CM | POA: Diagnosis present

## 2014-03-02 LAB — TYPE AND SCREEN
ABO/RH(D): A POS
ANTIBODY SCREEN: NEGATIVE

## 2014-03-02 LAB — ABO/RH: ABO/RH(D): A POS

## 2014-03-02 LAB — FIBRINOGEN: Fibrinogen: 379 mg/dL (ref 204–475)

## 2014-03-02 MED ORDER — BETAMETHASONE SOD PHOS & ACET 6 (3-3) MG/ML IJ SUSP
12.0000 mg | Freq: Once | INTRAMUSCULAR | Status: AC
  Administered 2014-03-03: 12 mg via INTRAMUSCULAR
  Filled 2014-03-02: qty 2

## 2014-03-02 MED ORDER — ZOLPIDEM TARTRATE 5 MG PO TABS
5.0000 mg | ORAL_TABLET | Freq: Every evening | ORAL | Status: DC | PRN
  Administered 2014-03-02 – 2014-03-06 (×5): 5 mg via ORAL
  Filled 2014-03-02 (×6): qty 1

## 2014-03-02 MED ORDER — LACTATED RINGERS IV SOLN
INTRAVENOUS | Status: DC
  Administered 2014-03-02 – 2014-03-03 (×4): via INTRAVENOUS

## 2014-03-02 MED ORDER — PRENATAL MULTIVITAMIN CH
1.0000 | ORAL_TABLET | Freq: Every day | ORAL | Status: DC
  Administered 2014-03-03 – 2014-03-06 (×4): 1 via ORAL
  Filled 2014-03-02 (×4): qty 1

## 2014-03-02 MED ORDER — MAGNESIUM SULFATE 40 G IN LACTATED RINGERS - SIMPLE
2.0000 g/h | INTRAVENOUS | Status: DC
  Administered 2014-03-02: 2 g/h via INTRAVENOUS
  Filled 2014-03-02: qty 500

## 2014-03-02 MED ORDER — MAGNESIUM SULFATE BOLUS VIA INFUSION
4.0000 g | Freq: Once | INTRAVENOUS | Status: AC
  Administered 2014-03-02: 4 g via INTRAVENOUS
  Filled 2014-03-02: qty 500

## 2014-03-02 MED ORDER — BETAMETHASONE SOD PHOS & ACET 6 (3-3) MG/ML IJ SUSP
12.0000 mg | INTRAMUSCULAR | Status: DC
  Filled 2014-03-02 (×2): qty 2

## 2014-03-02 MED ORDER — NIFEDIPINE 10 MG PO CAPS
10.0000 mg | ORAL_CAPSULE | Freq: Four times a day (QID) | ORAL | Status: DC
  Administered 2014-03-03: 10 mg via ORAL
  Filled 2014-03-02 (×4): qty 1

## 2014-03-02 MED ORDER — MAGNESIUM SULFATE 40 G IN LACTATED RINGERS - SIMPLE
2.0000 g/h | INTRAVENOUS | Status: AC
  Filled 2014-03-02: qty 500

## 2014-03-02 MED ORDER — DOCUSATE SODIUM 100 MG PO CAPS
100.0000 mg | ORAL_CAPSULE | Freq: Every day | ORAL | Status: DC
  Administered 2014-03-03 – 2014-03-07 (×5): 100 mg via ORAL
  Filled 2014-03-02 (×5): qty 1

## 2014-03-02 MED ORDER — CALCIUM CARBONATE ANTACID 500 MG PO CHEW: 2.0000 | CHEWABLE_TABLET | ORAL | Status: DC | PRN

## 2014-03-02 MED ORDER — ACETAMINOPHEN 325 MG PO TABS: 650.0000 mg | ORAL_TABLET | ORAL | Status: DC | PRN

## 2014-03-02 NOTE — Consult Note (Signed)
MFM Note  Ms. Sharol HarnessSimmons is a 28 year old G2P1 Caucasian female at 27+1 weeks who presented to our unit for consultation and ultrasound. Ms. Sharol HarnessSimmons was admitted last night with heavy vaginal bleeding secondary to a known placenta previa. This was her first bleed. The bleeding had basically stopped by [facility] and currently has some brown discharge. She reports no significant contractions or pain. She denies any other prenatal complications and has been on pelvic rest.  Since admission, she has had no further BR bleeding; has received first dose of BMZ course; on magnesium sulfate  G1: 2014; SVD; 8+3 female; no complications  US today: Partial/marginal anterior placenta previa (placenta ends just past internal os); singleton; no structural abnormalities; normal AFV; EFW at the 60th %tile (2+6); no subchorionic fluid collections ie. hemorrhage  Labs: A+; antibodies: negative; fibrinogen normal  Assessment: 1) SIUP at 27+1 weeks 2) Vaginal bleeding secondary to partial placenta previa 3) Partial previa 4) Received first dose of BMZ  Recommendations:  1) Would observe inhouse for 5-7 days 2) If no further bleeding, then d/c and follow as an outpt (she has family/friend support, cell phone and transportation) 3) Consider discontinuing magnesium sulfate after 24 hours, if delivery is not imminent  4) Complete course of BMZ 5) US for growth in 3 weeks 6) Serial CBCs  Thank you and please call with any concerns or questions. O: [phone]; front desk: [phone]

## 2014-03-02 NOTE — Consult Note (Signed)
[facility]       03/02/2014    8:38 PM  I was called at the request of the patient's obstetrician (Dr. Juliene PinaMody) to speak to this patient due to potential preterm delivery as early as 27 weeks due to placenta previa.  The patient's prenatal course includes partial placenta previa.  She was recently admitted with her first episode of bleeding.  She is 27 1/7 weeks currently.  She is admitted to antenatal unit, and is receiving treatment that includes magnesium sulfate and a course of betamethasone.  The baby is a female.  I reviewed expectations for a baby born at 27+ weeks, including survival, length of stay, morbidities such as respiratory distress, IVH, infection, feeding intolerance, retinopathy for gestations ranging from 27 to 34 weeks.  I described how we provide respiratory and feeding support.  I let mom know that the baby's outlook generally improves the longer she remains undelivered.  I spent 20 minutes reviewing the record, speaking to the patient and her husband, and entering appropriate documentation.  More than 50% of the time was spent face to face with patient.   _____________________ Electronically Signed By: Angelita InglesMcCrae S. Naser Schuld, MD Neonatologist

## 2014-03-02 NOTE — H&P (Signed)
[name] is a 28 y.o. female presenting for Maternal Medical History:  Reason for admission: Vaginal bleeding.    28 yo, G2P1001 at 27 wks, [facility] for bleeding placenta previa in stable condition. Known placenta previa at 18 wks sonogram, patient was maitaining pelvic rest and activity limitations. She has her 1st unprovoked bleeding- copious vaginal bleeding on 8/16 late evening and so she [facility]. Per ED doctor I spoke with, marginal previa noted with documented large bleeding but no active bleeding since admission there, FHT- reassuring and patient was stable for transfer.  [facility] Dr Juliene PinaMody as primary. This was failed contraceptive pregnancy - patient was on Camilla- prog only pill since she was breast feeding, 1st baby SVD Oct'14.  GBS positive, no medical/surg hx.   OB History   Grav Para Term Preterm Abortions TAB SAB Ect Mult Living   1 1 1       1      Past Medical History  Diagnosis Date  . Medical history non-contributory   . Postpartum care following vaginal delivery (10/7) 04/23/2013   Past Surgical History  Procedure Laterality Date  . Muscle repair      right groin  . No past surgeries     Family History: family history includes Cancer in her maternal grandmother, mother, paternal aunt, and paternal grandmother. Social History:  reports that she has been smoking Cigarettes.  She has a 1.5 pack-year smoking history. She does not have any smokeless tobacco history on file. She reports that she does not drink alcohol or use illicit drugs.   Prenatal Transfer Tool  Maternal Diabetes: not tested this pregnancy Genetic Screening: Normal  Maternal Ultrasounds/Referrals: Normal female. Placenta previa.  Fetal Ultrasounds or other Referrals:  None Maternal Substance Abuse:  No Significant Maternal Medications:  None Significant Maternal Lab Results:  Lab values include:  Group B Strep positive Other Comments:  None  ROS bleeding    Temperature 98.9 F (37.2 C), temperature source Oral, resp. rate 18, height 5\' 5"  (1.651 m), weight 179 lb (81.194 kg), unknown if currently breastfeeding. Exam Physical Exam   A&O x 3, no acute distress. Pleasant HEENT neg, no thyromegaly Lungs CTA bilat CV RRR, SS2 normal Abdo soft, non tender, non acute, relaxed soft uterus, BREECH per [facility] sono Extr no edema/ tenderness Pelvic no active bleeding but draining dark blood per vagina FHT  150s/ + accels/ no decels/ mod variab - category I/ reactive Toco irritability noted   Prenatal labs: ABO, Rh:   Antibody:  neg  Rubella:  Immune RPR: NON REACTIVE (10/07 0315)  HBsAg:   neg HIV:   neg GBS:   Positive   Assessment/Plan:  28 yo, G2P1001, at 27 wks with placenta previa and 3rd trimester bleeding Magnesium, BTMZ (1st 12.43 am [facility] Bedrest, IVFluids, T&S, C/section reviewed.  MFM sono and consult in AM, NICU consult.  Risks/ maternal and fetal incl prematurity reviewed GBS positive urine.   Trula Frede R 03/02/2014, 2:19 AM

## 2014-03-02 NOTE — Progress Notes (Signed)
Pt to MFM for consult and ultrasound

## 2014-03-02 NOTE — Progress Notes (Signed)
Ur chart review completed.  

## 2014-03-02 NOTE — Progress Notes (Signed)
Patient ID: [name], female   DOB: 11-Nov-1985, 28 y.o.   MRN: [id number]  Previa admitted with bleeding but no active bleeding since then. MFM sono this morning, report pending. Bedside speculum exam noted slight red blood in the vagina but no active bleeding from the os. Pads checked, minimal bleeding. Continue bed rest, ok with bathroom privileges and start clear liquid diet. Stop magnesium at 24 hrs (2.30 am 8/18) and start Procardia 10mg  q 6 hrs after 6 hrs of stopping magnesium.

## 2014-03-02 NOTE — Plan of Care (Signed)
Problem: Consults Goal: Birthing Suites Patient Information Press F2 to bring up selections list  Outcome: Completed/Met Date Met:  03/02/14  Pt < [redacted] weeks EGA

## 2014-03-02 NOTE — Plan of Care (Signed)
Problem: Consults Goal: Neonatologist Consult Outcome: Progressing Nicu called for consult

## 2014-03-03 LAB — CBC
HCT: 28 % — ABNORMAL LOW (ref 36.0–46.0)
HEMOGLOBIN: 9.7 g/dL — AB (ref 12.0–15.0)
MCH: 30.9 pg (ref 26.0–34.0)
MCHC: 34.6 g/dL (ref 30.0–36.0)
MCV: 89.2 fL (ref 78.0–100.0)
PLATELETS: 224 10*3/uL (ref 150–400)
RBC: 3.14 MIL/uL — ABNORMAL LOW (ref 3.87–5.11)
RDW: 13.2 % (ref 11.5–15.5)
WBC: 9.8 10*3/uL (ref 4.0–10.5)

## 2014-03-03 MED ORDER — SODIUM CHLORIDE 0.9 % IJ SOLN
3.0000 mL | Freq: Two times a day (BID) | INTRAMUSCULAR | Status: DC
  Administered 2014-03-03 – 2014-03-06 (×5): 3 mL via INTRAVENOUS

## 2014-03-03 MED ORDER — FERROUS SULFATE 325 (65 FE) MG PO TABS
325.0000 mg | ORAL_TABLET | Freq: Every day | ORAL | Status: DC
  Administered 2014-03-04 – 2014-03-07 (×4): 325 mg via ORAL
  Filled 2014-03-03 (×4): qty 1

## 2014-03-03 NOTE — Progress Notes (Signed)
HD#2, placenta previa with bleeding  S; Pt notes no active bleeding overnight or this am. Some scant brown d/c w/ wiping today. Good FM, no LOF, no ctx. Had a BM today, some constipation at baseline. Some lightheadedness with hot shower this am.   Events: Mag stopped o/n, Procardia started  O: Filed Vitals:   03/03/14 1208  BP: 105/54  Pulse: 72  Temp:   Resp:    Gen: well appearing, no distress Abd: soft, gravid, NT LE: SCD's in place, ND GU: no staining on pad Toco: none FH: 140's, + accels, no decels, 10 beat var. AGA  CBC    Component Value Date/Time   WBC 9.8 03/03/2014 0758   RBC 3.14* 03/03/2014 0758   HGB 9.7* 03/03/2014 0758   HCT 28.0* 03/03/2014 0758   PLT 224 03/03/2014 0758   MCV 89.2 03/03/2014 0758   MCH 30.9 03/03/2014 0758   MCHC 34.6 03/03/2014 0758   RDW 13.2 03/03/2014 0758    A/P: 28 yo at 27'2 w/ marginal placental previa with bleed yesterday - placenta previa. S/p MFM consult who recommends inpt eval for 5-7 days. S/p NICU consult. S/p 24 hr Mag Sulfate. Now on Procardia, may need to hold doses based on pt's low bp. No current symptoms/ bleeding. OK to saline lock IV, move to q shift and prn fetal testing.  - MOD. D/w pt unclear at this time. If placenta migrates away from os, may still be a candidate for SVD - Desires TL if needing c/s - Anemia. Start iron, continue colace, monitor for constipation - FWB. Reassuring testing.   Teresa Daniel A. 03/03/2014 1:17 PM

## 2014-03-04 NOTE — Progress Notes (Signed)
HD#3, placenta previa with bleeding  S; Pt notes no active bleeding overnight or this am.  Some scant brown d/c w/ wiping today.  Good FM, no LOF, no ctx. Had a BM today, some constipation at baseline. Some lightheadedness with hot shower this am.  Procardia held due to low BP  O: BP 68/38  Pulse 58  Temp(Src) 99.4 F (37.4 C) (Oral)  Resp 18  Ht 5\' 5"  (1.651 m)  Wt 81.194 kg (179 lb)  BMI 29.79 kg/m2   Gen: well appearing, no distress NCAT Lgs: CTA CV: RRR Abd: soft, gravid, NT LE: SCD's in place, NT, no cords Neuro: non focal Skin: intact   GU: no bleeding noted Toco: none FH: 140's, + accels, no decels, 10 beat var. AGA Reassuring NST x 3, no contractions noted  Sono c/w AGA, Breech, nl AFI and anterior marginal previa  CBC    Component Value Date/Time   WBC 9.8 03/03/2014 0758   RBC 3.14* 03/03/2014 0758   HGB 9.7* 03/03/2014 0758   HCT 28.0* 03/03/2014 0758   PLT 224 03/03/2014 0758   MCV 89.2 03/03/2014 0758   MCH 30.9 03/03/2014 0758   MCHC 34.6 03/03/2014 0758   RDW 13.2 03/03/2014 0758    A/P:  1- 27wks 3d w/ marginal placental previa - currently stable, BMZ complete BRP with BRP Continue NST q shift  Desires TL if needing c/s 2- Anemia. iron, continue colace, monitor for constipation - FWB. Reassuring testing.  Remain inpt at this time  Lenoard AdenAAVON,Kana Reimann J 03/04/2014 8:57 AM

## 2014-03-05 LAB — TYPE AND SCREEN
ABO/RH(D): A POS
ANTIBODY SCREEN: NEGATIVE

## 2014-03-05 NOTE — Progress Notes (Signed)
Ur chart review completed.  

## 2014-03-05 NOTE — Progress Notes (Signed)
27 4/7 weeks  Previa S: denies any bleeding for past 1 1/2 to 2 day (+) FM O: VSS Afebrile Lungs clear to A Cor RRR Abd: gravid soft nontender Pelvic' deferred Pad no blood  Tracing: baseline 140 (+) accel 150-160 good variability no ctx   IMP: Previa Hx 3rd trim vaginal bleeding  Now resolved.  BMZ  Complete IUP @ 27 4/7 weeks P) cont inpt. Consider d/c home tomorrow if remains stable

## 2014-03-06 NOTE — Progress Notes (Signed)
HD#5, placenta previa with bleeding  S; Pt notes no active bleeding overnight or this am.  Some scant brown d/c w/ wiping today.  Good FM, no LOF, no ctx. Had a BM today, some constipation at baseline. Some lightheadedness with hot shower this am.  Procardia held due to low BP  O: BP 110/57  Pulse 66  Temp(Src) 98.1 F (36.7 C) (Oral)  Resp 18  Ht 5\' 5"  (1.651 m)  Wt 81.738 kg (180 lb 3.2 oz)  BMI 29.99 kg/m2   Gen: well appearing, no distress NCAT Lgs: CTA CV: RRR Abd: soft, gravid, NT LE: SCD's in place, NT, no cords Neuro: non focal Skin: intact   GU: no bleeding noted Toco: none FH: 140's, + accels, no decels, 10 beat var. AGA Reassuring NST x 3, no contractions noted  Sono c/w AGA, Breech, nl AFI and anterior marginal previa  CBC    Component Value Date/Time   WBC 9.8 03/03/2014 0758   RBC 3.14* 03/03/2014 0758   HGB 9.7* 03/03/2014 0758   HCT 28.0* 03/03/2014 0758   PLT 224 03/03/2014 0758   MCV 89.2 03/03/2014 0758   MCH 30.9 03/03/2014 0758   MCHC 34.6 03/03/2014 0758   RDW 13.2 03/03/2014 0758    A/P:  1- 27wks 5d w/ marginal placental previa - currently stable, BMZ complete BRP with BRP Continue NST q shift  Desires TL if needing c/s 2- Anemia. iron, continue colace, monitor for constipation - FWB. Reassuring testing.  Remain inpt at this time Will dc home if stable per MFM tomorrow Lenoard AdenAAVON,[name] 03/06/2014 3:36 PM

## 2014-03-07 NOTE — Progress Notes (Signed)
Patient teaching completed and reviewed with patient and significant other. Patient provided a teachback with s/s to report, when to return and need for follow up this week in office. Pt was discharged per wheelchair with significant other at side

## 2014-03-07 NOTE — Progress Notes (Signed)
HD#6, placenta previa wit  S; Pt notes no active bleeding overnight or this am.   Good FM, no LOF, no ctx. some constipation at baseline.   Procardia held due to low BP  O: BP 96/38  Pulse 75  Temp(Src) 98.4 F (36.9 C) (Oral)  Resp 20  Ht  (1.651 m)  Wt 81.738 kg (180 lb 3.2 oz)  BMI 29.99 kg/m2   Gen: well appearing, no distress NCAT Lgs: CTA CV: RRR Abd: soft, gravid, NT LE: SCD's in place, NT, no cords Neuro: non focal Skin: intact   GU: no bleeding noted Toco: none FH: 150s, + accels, no decels, 10 beat var. AGA Reassuring NST x 3, no contractions noted  Sono c/w AGA, Breech, nl AFI and anterior marginal previa  CBC    Component Value Date/Time   WBC 9.8 03/03/2014 0758   RBC 3.14* 03/03/2014 0758   HGB 9.7* 03/03/2014 0758   HCT 28.0* 03/03/2014 0758   PLT 224 03/03/2014 0758   MCV 89.2 03/03/2014 0758   MCH 30.9 03/03/2014 0758   MCHC 34.6 03/03/2014 0758   RDW 13.2 03/03/2014 0758    A/P:  1- 27wks 5d w/ marginal placental previa - currently stable, BMZ complete 2- Anemia. iron, continue colace, monitor for constipation - FWB. Reassuring testing.   Will dc home today Precautions given Fu office one week [name] J 03/07/2014 10:17 AM

## 2014-03-28 ENCOUNTER — Inpatient Hospital Stay (HOSPITAL_COMMUNITY): Payer: BC Managed Care – PPO

## 2014-03-28 ENCOUNTER — Encounter (HOSPITAL_COMMUNITY): Payer: Self-pay

## 2014-03-28 ENCOUNTER — Inpatient Hospital Stay (HOSPITAL_COMMUNITY)
Admission: AD | Admit: 2014-03-28 | Discharge: 2014-04-03 | DRG: 781 | Disposition: A | Discharge status: Home / Self Care | Payer: BC Managed Care – PPO | Source: Ambulatory Visit | Attending: Obstetrics and Gynecology | Admitting: Obstetrics and Gynecology

## 2014-03-28 DIAGNOSIS — O322XX0 Maternal care for transverse and oblique lie, not applicable or unspecified: Secondary | ICD-10-CM | POA: Diagnosis present

## 2014-03-28 DIAGNOSIS — Z22330 Carrier of Group B streptococcus: Secondary | ICD-10-CM | POA: Diagnosis not present

## 2014-03-28 DIAGNOSIS — O99019 Anemia complicating pregnancy, unspecified trimester: Secondary | ICD-10-CM | POA: Diagnosis present

## 2014-03-28 DIAGNOSIS — O4400 Placenta previa specified as without hemorrhage, unspecified trimester: Secondary | ICD-10-CM | POA: Diagnosis present

## 2014-03-28 DIAGNOSIS — O4413 Placenta previa with hemorrhage, third trimester: Secondary | ICD-10-CM

## 2014-03-28 DIAGNOSIS — O9989 Other specified diseases and conditions complicating pregnancy, childbirth and the puerperium: Secondary | ICD-10-CM

## 2014-03-28 DIAGNOSIS — K5900 Constipation, unspecified: Secondary | ICD-10-CM | POA: Diagnosis present

## 2014-03-28 DIAGNOSIS — D649 Anemia, unspecified: Secondary | ICD-10-CM | POA: Diagnosis present

## 2014-03-28 DIAGNOSIS — Z87891 Personal history of nicotine dependence: Secondary | ICD-10-CM | POA: Diagnosis not present

## 2014-03-28 DIAGNOSIS — O99891 Other specified diseases and conditions complicating pregnancy: Secondary | ICD-10-CM | POA: Diagnosis present

## 2014-03-28 DIAGNOSIS — O4410 Placenta previa with hemorrhage, unspecified trimester: Secondary | ICD-10-CM | POA: Diagnosis present

## 2014-03-28 HISTORY — DX: Complete placenta previa nos or without hemorrhage, unspecified trimester: O44.00

## 2014-03-28 LAB — CBC
HCT: 31.4 % — ABNORMAL LOW (ref 36.0–46.0)
Hemoglobin: 11.1 g/dL — ABNORMAL LOW (ref 12.0–15.0)
MCH: 30.9 pg (ref 26.0–34.0)
MCHC: 35.4 g/dL (ref 30.0–36.0)
MCV: 87.5 fL (ref 78.0–100.0)
Platelets: 215 10*3/uL (ref 150–400)
RBC: 3.59 MIL/uL — ABNORMAL LOW (ref 3.87–5.11)
RDW: 12.7 % (ref 11.5–15.5)
WBC: 7.5 10*3/uL (ref 4.0–10.5)

## 2014-03-28 LAB — APTT: aPTT: 25 seconds (ref 24–37)

## 2014-03-28 LAB — TYPE AND SCREEN
ABO/RH(D): A POS
Antibody Screen: NEGATIVE

## 2014-03-28 LAB — PROTIME-INR
INR: 0.93 (ref 0.00–1.49)
Prothrombin Time: 12.5 seconds (ref 11.6–15.2)

## 2014-03-28 LAB — FIBRINOGEN: Fibrinogen: 443 mg/dL (ref 204–475)

## 2014-03-28 LAB — D-DIMER, QUANTITATIVE: D-Dimer, Quant: 0.62 ug/mL-FEU — ABNORMAL HIGH (ref 0.00–0.48)

## 2014-03-28 MED ORDER — MAGNESIUM SULFATE 40 G IN LACTATED RINGERS - SIMPLE
2.0000 g/h | INTRAVENOUS | Status: DC
  Administered 2014-03-28: 2 g/h via INTRAVENOUS
  Filled 2014-03-28: qty 500

## 2014-03-28 MED ORDER — DOCUSATE SODIUM 100 MG PO CAPS
100.0000 mg | ORAL_CAPSULE | Freq: Two times a day (BID) | ORAL | Status: DC
  Administered 2014-03-28 – 2014-04-03 (×12): 100 mg via ORAL
  Filled 2014-03-28 (×14): qty 1

## 2014-03-28 MED ORDER — MAGNESIUM SULFATE BOLUS VIA INFUSION
4.0000 g | Freq: Once | INTRAVENOUS | Status: AC
  Administered 2014-03-28: 4 g via INTRAVENOUS
  Filled 2014-03-28: qty 500

## 2014-03-28 MED ORDER — PRENATAL MULTIVITAMIN CH
1.0000 | ORAL_TABLET | Freq: Every day | ORAL | Status: DC
  Administered 2014-03-29 – 2014-04-03 (×6): 1 via ORAL
  Filled 2014-03-28 (×7): qty 1

## 2014-03-28 MED ORDER — LACTATED RINGERS IV SOLN
INTRAVENOUS | Status: DC
Start: 2014-03-28 — End: 2014-04-03
  Administered 2014-03-28: 125 mL/h via INTRAVENOUS
  Administered 2014-03-29: 06:00:00 via INTRAVENOUS

## 2014-03-28 MED ORDER — PRENATAL MULTIVITAMIN CH: 1.0000 | ORAL_TABLET | Freq: Every day | ORAL | Status: DC

## 2014-03-28 MED ORDER — ZOLPIDEM TARTRATE 5 MG PO TABS
5.0000 mg | ORAL_TABLET | Freq: Every evening | ORAL | Status: DC | PRN
  Administered 2014-03-29 – 2014-04-02 (×5): 5 mg via ORAL
  Filled 2014-03-28 (×5): qty 1

## 2014-03-28 MED ORDER — FAMOTIDINE 20 MG PO TABS
20.0000 mg | ORAL_TABLET | Freq: Every day | ORAL | Status: DC
  Administered 2014-03-28 – 2014-04-02 (×6): 20 mg via ORAL
  Filled 2014-03-28 (×6): qty 1

## 2014-03-28 MED ORDER — DIPHENHYDRAMINE HCL 25 MG PO TABS
25.0000 mg | ORAL_TABLET | Freq: Four times a day (QID) | ORAL | Status: DC | PRN
  Filled 2014-03-28: qty 1

## 2014-03-28 MED ORDER — ACETAMINOPHEN 325 MG PO TABS
650.0000 mg | ORAL_TABLET | ORAL | Status: DC | PRN
  Administered 2014-03-29: 650 mg via ORAL
  Filled 2014-03-28: qty 2

## 2014-03-28 NOTE — H&P (Signed)
  OB ADMISSION/ HISTORY & PHYSICAL:  Admission Date: 03/28/2014  6:04 PM  Admit Diagnosis: 30.6 weeks placenta previa / active bleeding - third trimester  [name] is a 28 y.o. female presenting for second placenta bleed with complete previa.  Prenatal History: G2P1001   EDC : 05/31/2014, by Other Basis  Prenatal care at [facility]  Primary Ob Provider: Mody Prenatal course complicated by pregnancy close interval / anemia / previa /   Prenatal Labs: ABO, Rh: --/--/A POS (08/20 0515) Antibody: NEG (08/20 0515) Rubella:   Immune RPR: NON REACTIVE (10/07 0315)  HBsAg:   neg HIV:   NR GTT: NL GBS:   Positive bacturia  Medical / Surgical History :  Past medical history:  Past Medical History  Diagnosis Date  . Medical history non-contributory   . Postpartum care following vaginal delivery (10/7) 04/23/2013  . Placenta previa     Past surgical history:  Past Surgical History  Procedure Laterality Date  . Muscle repair      right groin  . No past surgeries     Family History:  Family History  Problem Relation Age of Onset  . Cancer Mother   . Cancer Paternal Aunt   . Cancer Maternal Grandmother   . Cancer Paternal Grandmother     Social History:  reports that she has quit smoking. Her smoking use included Cigarettes. She has a 1.5 pack-year smoking history. She has never used smokeless tobacco. She reports that she does not drink alcohol or use illicit drugs.  Allergies: Review of patient's allergies indicates no known allergies.    Current Medications at time of admission:  Prior to Admission medications   Medication Sig Start Date End Date Taking? Authorizing Provider  diphenhydrAMINE (BENADRYL) 25 MG tablet Take 25 mg by mouth every 6 (six) hours as needed for allergies.     Historical Provider, MD  Prenatal Vit-Fe Fumarate-FA (PRENATAL MULTIVITAMIN) TABS tablet Take 1 tablet by mouth daily at 12 noon.    Historical Provider, MD  ranitidine  (ZANTAC) 75 MG tablet Take 75 mg by mouth daily as needed for heartburn.    Historical Provider, MD   Review of Systems: Active FM Denies ctx or cramping Brown dc this am  Constipation (+) Active red bleeding ~ 5pm  Physical Exam:  VS: Blood pressure 123/61, temperature 98.1 F (36.7 C), temperature source Oral, resp. rate 16, SpO2 100.00%, not currently breastfeeding.  General: alert and oriented, appears NAD or pain Heart: RRR Lungs: Clear lung fields Abdomen: Gravid, soft and non-tender, non-distended / uterus: gravid / soft - transverse lie Extremities: no edema  FHR: baseline rate 145 / variability moderate / accelerations + / no decelerations TOCO: no ctx or UI  SSE by Dr Cherly Hensen - cervix appears visually closed / 10cc red blood in vault  Assessment: 30.[redacted] weeks gestation Complete placenta PREVIA  Third trimester placenta bleeding episode (2) FHR category 1   Plan:  Admit - antenatal Magnesium neuro  Prophylaxis sono NICU consult (done last visit)  Dr Cherly Hensen notified of admission / plan of care   Marlinda Mike CNM, MSN, [facility] 03/28/2014, 6:40 PM

## 2014-03-28 NOTE — MAU Note (Signed)
Report called to [facility] charge RN. Will call back with room

## 2014-03-28 NOTE — MAU Note (Signed)
Pt states here for vag bleeding. Was brown this am. 45 minutes ago began bleeding heavier and came here for eval. Was on antenatal for 8 days before being discharged to home. Saw red smear at home on pad

## 2014-03-28 NOTE — MAU Provider Note (Signed)
  History     CSN: [id number]  Arrival date and time: 03/28/14 4098  Provider here to see patient @ 1825   Chief Complaint  Patient presents with  . Vaginal Bleeding   HPI Known previa Reports brown dc this am after BM - straining with constipation Onset bright red bleeding ~ 5pm  Past Medical History  Diagnosis Date  . Medical history non-contributory   . Postpartum care following vaginal delivery (10/7) 04/23/2013  . Placenta previa     Past Surgical History  Procedure Laterality Date  . Muscle repair      right groin  . No past surgeries      Family History  Problem Relation Age of Onset  . Cancer Mother   . Cancer Paternal Aunt   . Cancer Maternal Grandmother   . Cancer Paternal Grandmother     History  Substance Use Topics  . Smoking status: Former Smoker -- 0.50 packs/day for 3 years    Types: Cigarettes  . Smokeless tobacco: Never Used  . Alcohol Use: No    Allergies: No Known Allergies  Prescriptions prior to admission  Medication Sig Dispense Refill  . diphenhydrAMINE (BENADRYL) 25 MG tablet Take 25 mg by mouth every 6 (six) hours as needed for allergies.       . Prenatal Vit-Fe Fumarate-FA (PRENATAL MULTIVITAMIN) TABS tablet Take 1 tablet by mouth daily at 12 noon.      . ranitidine (ZANTAC) 75 MG tablet Take 75 mg by mouth daily as needed for heartburn.        ROS Physical Exam   Blood pressure 123/61, temperature 98.1 F (36.7 C), temperature source Oral, resp. rate 16, SpO2 100.00%, not currently breastfeeding.  Physical Exam Alert and oriented Abdomen soft and non-tender Leopold transverse lie VE deferred - bright red bleeding small amount peri-pad and down inner thigh  MAU Course  Procedures  Assessment and Plan  30.6 weeks active bleeding previa Category 1 tracing NO ctx on admission  1) admit 2) Dr Cherly Hensen updated with status - orders  Marlinda Mike 03/28/2014, 6:36 PM

## 2014-03-29 MED ORDER — SODIUM CHLORIDE 0.9 % IJ SOLN
3.0000 mL | Freq: Two times a day (BID) | INTRAMUSCULAR | Status: DC
Start: 2014-03-29 — End: 2014-04-03
  Administered 2014-03-29 – 2014-04-02 (×8): 3 mL via INTRAVENOUS

## 2014-03-29 NOTE — Progress Notes (Signed)
HD #2 31 wk Previa 2nd bleed BMZ complete GBS cx (+) Magnesium Sulfate No complaint. Denies ctx. (+) FM O:  Filed Vitals:   03/29/14 0602 03/29/14 0701 03/29/14 0801 03/29/14 0906  BP: 93/51 95/55 101/52   Pulse: 76 76 109   Temp:   97.9 F (36.6 C)   TempSrc:   Oral   Resp: Height:      Weight:      SpO2:      Lungs  Clear to A Cor RRR Abd gravid soft nontender Pelvic exam deferred Scant blood Ext no edema or calf tenderness CBC    Component Value Date/Time   WBC 7.5 03/28/2014 1900   RBC 3.59* 03/28/2014 1900   HGB 11.1* 03/28/2014 1900   HCT 31.4* 03/28/2014 1900   PLT 215 03/28/2014 1900   MCV 87.5 03/28/2014 1900   MCH 30.9 03/28/2014 1900   MCHC 35.4 03/28/2014 1900   RDW 12.7 03/28/2014 1900     Tracing:  No ctx. Baseline 130 (+) accel to 180   sono: transverse lie. FUNIC presentation, previa IMP: Placenta previa w/ 2nd episode of vaginal bleeding BMZ complete. Magnesium for neuroprophylaxis IUP@ 31 week P) HL IV. May do BRP. Cont inpt status. D/c magnesium monitor for bleeding

## 2014-03-30 NOTE — Progress Notes (Signed)
Hospital day # 3 pregnancy at [redacted]w[redacted]d Placenta Previa.  BMethasone/MgSO4 completed.  S: well, reports good fetal activity      Contractions:none      Vaginal bleeding:none now, last mild trace of blood yesterday PM       Vaginal discharge: no significant change  O: BP 115/74  Pulse 73  Temp(Src) 97.7 F (36.5 C) (Oral)  Resp 18  Ht  (1.651 m)  Wt 82.555 kg (182 lb)  BMI 30.29 kg/m2  SpO2 98%  Breastfeeding? No      Fetal tracings:Fetal heart variability: moderate, accelerations present, no deceleration.  Base line 120-125's 03/29/14      Today's tracing pending. reviewed and reassuring      Uterus non-tender      Extremities: no significant edema and no signs of DVT  A: [redacted]w[redacted]d with Placenta Previa.  2nd episode of bleeding.  BMethasone/MgSO4 completed.     Stable with no vaginal bleeding and Cat. 1 FHR tracing.  P: continue current plan of care  Jwan Hornbaker,MARIE-LYNE  MD 03/30/2014 9:35 AM

## 2014-03-31 LAB — TYPE AND SCREEN
ABO/RH(D): A POS
Antibody Screen: NEGATIVE

## 2014-03-31 NOTE — Progress Notes (Signed)
Hospital day # 4 pregnancy at [redacted]w[redacted]d Placenta Previa.  BMZ/MgSO4 completed.  S: well, reports good FM      Contractions:none      Vaginal bleeding:none       Vaginal discharge: no significant change  O: BP 103/58  Pulse 72  Temp(Src) 98.2 F (36.8 C) (Oral)  Resp 18  Ht  (1.651 m)  Wt 82.555 kg (182 lb)  BMI 30.29 kg/m2  SpO2 98%  Breastfeeding? No      Fetal tracings:Fetal heart variability: moderate, accelerations present, no deceleration.  Base line 120-125's 9/14 Reactive NST x 3 with NsT q shift  NCAT Lungs: CTA CV: RRR Abd: soft , gravid , NT No CVAT Uterus non-tender Neuro : non focal Skin: intact Extremities: no significant edema and no signs of DVT  Sono c/w AGA , nl AFI and good interval growth with marginal previa  A: [redacted]w[redacted]d with Placenta Previa.  2nd episode of bleeding.  BMZ/MgSO4 completed.     Stable with no vaginal bleeding and Cat. 1 FHR tracing.  P: continue current plan of care NST q  shift T&S current Bedrest with BRP  [name]  MD 03/31/2014 9:48 AM

## 2014-04-01 ENCOUNTER — Encounter (HOSPITAL_COMMUNITY): Payer: Self-pay

## 2014-04-01 MED ORDER — FERROUS SULFATE 325 (65 FE) MG PO TABS: 325.0000 mg | ORAL_TABLET | Freq: Every day | ORAL | Status: DC

## 2014-04-01 NOTE — Plan of Care (Signed)
Problem: Phase I Progression Outcomes Goal: No significant worsening bleeding/cervix change/vag drainage No significant worsening in vaginal bleeding, cervical change, or vaginal drainage.  Outcome: Progressing Discussed with patient about continuing pad count to assess bleeding. Pt verbalizes and understands.

## 2014-04-01 NOTE — Plan of Care (Signed)
Problem: Consults Goal: Neonatologist Consult Outcome: Completed/Met Date Met:  04/01/14 Neonatal MD consulted with patient during prior admission.

## 2014-04-01 NOTE — Progress Notes (Signed)
Pt on wheelchair ride

## 2014-04-02 NOTE — Progress Notes (Signed)
Late Entry  Pt seen 8 am by Dr. Juliene Pina, Pt reported no further bleeding or cramping. Visit to floor by me at 1p, pt not in room- on extended wheelchair ride, eating lunch in cafe with friends Husband reports no issues.  Vitals review  No exam  NST: 120's, +accels, no decels 10 beat var  A/P: Placenta previa, 2nd episode bleeding - In house x 1 wk from 2nd bleed, modified bed rest, routine fetal monitoring. - u/s 9/12; AGA, anterior placenta at edge of internal os, not covering.  - s/p BMZ, Mag - Mild anemia but pt did not tolerate iron in past. Severe constipation from iron caused straining just prior to most recent bleed. - reactive fetal testing  [name] A. 04/02/2014 7:29 AM

## 2014-04-02 NOTE — Plan of Care (Signed)
Problem: Consults Goal: Birthing Suites Patient Information Press F2 to bring up selections list  Outcome: Not Applicable Date Met:  19/47/12  Pt not admitted to [facility] at this time

## 2014-04-02 NOTE — Progress Notes (Signed)
S: 31 3/7 weeks Placenta previa stable Denies ctx/abdominal cramping/vaginal bleeding  O: VSS Afebrile Lungs clear to A Cor RRR Abd: gravid soft nontender Pad none Extr no edema or calf tenderness  Tracing: baseline 130 (+) accel 145-150 No ctx  IMP: Placenta previa s/p 2nd bleeding admit. Completed Magnesium neuroprophylaxis x 2 and BMZ complete@ 28 weeks IUP @ 31 3/7 weeks GBS cx (+) P) cont present inpt mgmt

## 2014-04-02 NOTE — Plan of Care (Signed)
Problem: Consults Goal: Case Manager Consult Outcome: Completed/Met Date Met:  04/02/14 Case Manager saw patient during previous admission on 03/05/14.

## 2014-04-02 NOTE — Progress Notes (Signed)
S: 31 4/7 weeks Placenta previa stable Denies ctx/abdominal cramping/vaginal bleeding  O: VSS Afebrile Lungs clear to A Cor RRR Abd: gravid soft nontender Pad none Extr no edema or calf tenderness  Tracing: baseline 130 (+) accel 145-150 No ctx  IMP: Placenta previa s/p 2nd bleeding admit. Completed Magnesium neuroprophylaxis x 2 and BMZ complete@ 28 weeks IUP @ 31 4/7 weeks GBS cx (+) P) cont present inpt mgmt

## 2014-04-03 MED ORDER — CYCLOBENZAPRINE HCL 5 MG PO TABS: 5.0000 mg | ORAL_TABLET | Freq: Once | ORAL | Status: AC

## 2014-04-03 MED ORDER — DSS 100 MG PO CAPS: 100.0000 mg | ORAL_CAPSULE | Freq: Two times a day (BID) | ORAL | Status: AC

## 2014-04-03 MED ORDER — CYCLOBENZAPRINE HCL 10 MG PO TABS
5.0000 mg | ORAL_TABLET | Freq: Once | ORAL | Status: AC
  Administered 2014-04-03: 5 mg via ORAL
  Filled 2014-04-03: qty 1

## 2014-04-03 NOTE — Discharge Summary (Signed)
Physician Discharge Summary  Patient ID: [name] MRN: [id number] DOB/AGE: July 17, 1986 27 y.o.  Admit date: 03/28/2014 Discharge date: 04/03/2014  Admission Diagnoses: 3rd trimester bleeding from placenta previa. 31 wks  Discharge Diagnoses: Marginal placenta previa, bleeding resolved, 31.6 wks.   Discharged Condition:  Stable.   Hospital Course: patient admitted on 9/12 with second episode of bleeding from placenta previa. This was less bleeding than her initial bleeding. She is s/p betamethasone and magnesium sulphate at prior admission. Admission sono noted marginal previa and small subchorionic hemorrhage. Since admission no further bleeding noted/ reported and no uterine irritability noted. FHT reactive on NSTs. Patient was discussed with MFM Dr Sherrie George on 9/18 and discharged home since completely stable and reliable patient.   Consults: MFM  Significant Diagnostic Studies: Ob sonogram  Discharge Exam: Blood pressure 114/64, pulse 71, temperature 97.5 F (36.4 C), temperature source Oral, resp. rate 18, height  (1.651 m), weight 182 lb (82.555 kg), SpO2 98.00%, not currently breastfeeding. Uterus soft, non tender, no vaginal bleeding.   Disposition: 01-Home or Self Care  Discharge Instructions   Call MD for:  difficulty breathing, headache or visual disturbances    Complete by:  As directed      Call MD for:  extreme fatigue    Complete by:  As directed      Call MD for:  hives    Complete by:  As directed      Call MD for:  persistant dizziness or light-headedness    Complete by:  As directed      Call MD for:  persistant nausea and vomiting    Complete by:  As directed      Call MD for:  redness, tenderness, or signs of infection (pain, swelling, redness, odor or green/yellow discharge around incision site)    Complete by:  As directed      Call MD for:  severe uncontrolled pain    Complete by:  As directed      Call MD for:  temperature >100.4    Complete by:   As directed      Call MD for:    Complete by:  As directed   Vaginal bleeding     Diet - low sodium heart healthy    Complete by:  As directed      Driving Restrictions    Complete by:  As directed   No driving     Increase activity slowly    Complete by:  As directed      Lifting restrictions    Complete by:  As directed   NO lifting at all     Sexual Activity Restrictions    Complete by:  As directed   Complete pelvic rest, no sex, no intercourse, no orgasm            Medication List         COLACE PO  Take 1 capsule by mouth daily. Pt is unsure of dose.     DSS 100 MG Caps  Take 100 mg by mouth 2 (two) times daily.     cyclobenzaprine 5 MG tablet  Commonly known as:  FLEXERIL  Take 1 tablet (5 mg total) by mouth once.     ferrous sulfate 325 (65 FE) MG tablet  Take 325 mg by mouth daily.     prenatal multivitamin Tabs tablet  Take 1 tablet by mouth daily.           Follow-up Information  Follow up with Debera Sterba R, MD. Call in 1 week. (for appointment reschedule)    Specialty:  Obstetrics and Gynecology   Contact information:   [address] [place] [address] [phone]       Follow up with Traven Davids R, MD In 10 days.   Specialty:  Obstetrics and Gynecology   Contact information:   [address] [place] [address] [phone]       Signed: Robley Fries 04/03/2014, 11:25 AM

## 2014-04-03 NOTE — Discharge Instructions (Signed)
Placenta Previa  Placenta previa is a condition in pregnant women where the placenta implants in the lower part of the uterus. The placenta either partially or completely covers the opening to the cervix. This is a problem because the baby must pass through the cervix during delivery. There are three types of placenta previa. They include:  1. Marginal placenta previa. The placenta is near the cervix, but does not cover the opening. 2. Partial placenta previa. The placenta covers part of the cervical opening. 3. Complete placenta previa. The placenta covers the entire cervical opening.  Depending on the type of placenta previa, there is a chance the placenta may move into a normal position and no longer cover the cervix as the pregnancy progresses. It is important to keep all prenatal visits with your caregiver.  RISK FACTORS You may be more likely to develop placenta previa if you:   Are carrying more than one baby (multiples).   Have an abnormally shaped uterus.   Have scars on the lining of the uterus.   Had previous surgeries involving the uterus, such as a cesarean delivery.   Have delivered a baby previously.   Have a history of placenta previa.   Have smoked or used cocaine during pregnancy.   Are age 27 or older during pregnancy.  SYMPTOMS The main symptom of placenta previa is sudden, painless vaginal bleeding during the second half of pregnancy. The amount of bleeding can be light to very heavy. The bleeding may stop on its own, but almost always returns. Cramping, regular contractions, abdominal pain, and lower back pain can also occur with placenta previa.  DIAGNOSIS Placenta previa can be diagnosed through an ultrasound by finding where the placenta is located. The ultrasound may find placenta previa either during a routine prenatal visit or after vaginal bleeding is noticed. If you are diagnosed with placenta previa, your caregiver may avoid vaginal exams to reduce  the risk of heavy bleeding. There is a chance that placenta previa may not be diagnosed until bleeding occurs during labor.  TREATMENT Specific treatment depends on:   How much you are bleeding or if the bleeding has stopped.  How far along you are in your pregnancy.   The condition of the baby.   The location of the baby and placenta.   The type of placenta previa.  Depending on the factors above, your caregiver may recommend:   Decreased activity.   Bed rest at home or in the hospital.  Pelvic rest. This means no sex, using tampons, douching, pelvic exams, or placing anything into the vagina.  A blood transfusion to replace maternal blood loss.  A cesarean delivery if the bleeding is heavy and cannot be controlled or the placenta completely covers the cervix.  Medication to stop premature labor or mature the fetal lungs if delivery is needed before the pregnancy is full term.  WHEN SHOULD YOU SEEK IMMEDIATE MEDICAL CARE IF YOU ARE SENT HOME WITH PLACENTA PREVIA? Seek immediate medical care if you show any symptoms of placenta previa. You will need to go to the hospital to get checked immediately. Again, those symptoms are:  Sudden, painless vaginal bleeding, even a small amount.  Cramping or regular contractions.  Lower back or abdominal pain. Document Released: 07/03/2005 Document Revised: 03/05/2013 Document Reviewed: 10/04/2012 [facility] Patient Information 2015 [place], [place]. This information is not intended to replace advice given to you by your health care provider. Make sure you discuss any questions you have with your health  care provider. Vaginal Bleeding During Pregnancy, Second Trimester A small amount of bleeding (spotting) from the vagina is relatively common in pregnancy. It usually stops on its own. Various things can cause bleeding or spotting in pregnancy. Some bleeding may be related to the pregnancy, and some may not. Sometimes the bleeding is normal  and is not a problem. However, bleeding can also be a sign of something serious. Be sure to tell your health care provider about any vaginal bleeding right away. Some possible causes of vaginal bleeding during the second trimester include:  Infection, inflammation, or growths on the cervix.   The placenta may be partially or completely covering the opening of the cervix inside the uterus (placenta previa).  The placenta may have separated from the uterus (abruption of the placenta).   You may be having early (preterm) labor.   The cervix may not be strong enough to keep a baby inside the uterus (cervical insufficiency).   Tiny cysts may have developed in the uterus instead of pregnancy tissue (molar pregnancy). HOME CARE INSTRUCTIONS  Watch your condition for any changes. The following actions may help to lessen any discomfort you are feeling:  Follow your health care provider's instructions for limiting your activity. If your health care provider orders bed rest, you may need to stay in bed and only get up to use the bathroom. However, your health care provider may allow you to continue light activity.  If needed, make plans for someone to help with your regular activities and responsibilities while you are on bed rest.  Keep track of the number of pads you use each day, how often you change pads, and how soaked (saturated) they are. Write this down.  Do not use tampons. Do not douche.  Do not have sexual intercourse or orgasms until approved by your health care provider.  If you pass any tissue from your vagina, save the tissue so you can show it to your health care provider.  Only take over-the-counter or prescription medicines as directed by your health care provider.  Do not take aspirin because it can make you bleed.  Do not exercise or perform any strenuous activities or heavy lifting without your health care provider's permission.  Keep all follow-up appointments as  directed by your health care provider. SEEK MEDICAL CARE IF:  You have any vaginal bleeding during any part of your pregnancy.  You have cramps or labor pains.  You have a fever, not controlled by medicine. SEEK IMMEDIATE MEDICAL CARE IF:   You have severe cramps in your back or belly (abdomen).  You have contractions.  You have chills.  You pass large clots or tissue from your vagina.  Your bleeding increases.  You feel light-headed or weak, or you have fainting episodes.  You are leaking fluid or have a gush of fluid from your vagina. MAKE SURE YOU:  Understand these instructions.  Will watch your condition.  Will get help right away if you are not doing well or get worse. Document Released: 04/12/2005 Document Revised: 07/08/2013 Document Reviewed: 03/10/2013 [facility] Patient Information 2015 [place], [place]. This information is not intended to replace advice given to you by your health care provider. Make sure you discuss any questions you have with your health care provider. Fetal Movement Counts Patient Name: __________________________________________________ Patient Due Date: ____________________ Performing a fetal movement count is highly recommended in high-risk pregnancies, but it is good for every pregnant woman to do. Your health care provider may ask you  to start counting fetal movements at 28 weeks of the pregnancy. Fetal movements often increase:  After eating a full meal.  After physical activity.  After eating or drinking something sweet or cold.  At rest. Pay attention to when you feel the baby is most active. This will help you notice a pattern of your baby's sleep and wake cycles and what factors contribute to an increase in fetal movement. It is important to perform a fetal movement count at the same time each day when your baby is normally most active.  HOW TO COUNT FETAL MOVEMENTS 1. Find a quiet and comfortable area to sit or lie down on your left  side. Lying on your left side provides the best blood and oxygen circulation to your baby. 2. Write down the day and time on a sheet of paper or in a journal. 3. Start counting kicks, flutters, swishes, rolls, or jabs in a 2-hour period. You should feel at least 10 movements within 2 hours. 4. If you do not feel 10 movements in 2 hours, wait 2-3 hours and count again. Look for a change in the pattern or not enough counts in 2 hours. SEEK MEDICAL CARE IF:  You feel less than 10 counts in 2 hours, tried twice.  There is no movement in over an hour.  The pattern is changing or taking longer each day to reach 10 counts in 2 hours.  You feel the baby is not moving as he or she usually does. Date: ____________ Movements: ____________ Start time: ____________ [name] time: ____________  Date: ____________ Movements: ____________ Start time: ____________ [name] time: ____________ Date: ____________ Movements: ____________ Start time: ____________ [name] time: ____________ Date: ____________ Movements: ____________ Start time: ____________ [name] time: ____________ Date: ____________ Movements: ____________ Start time: ____________ [name] time: ____________ Date: ____________ Movements: ____________ Start time: ____________ [name] time: ____________ Date: ____________ Movements: ____________ Start time: ____________ [name] time: ____________ Date: ____________ Movements: ____________ Start time: ____________ [name] time: ____________  Date: ____________ Movements: ____________ Start time: ____________ [name] time: ____________ Date: ____________ Movements: ____________ Start time: ____________ [name] time: ____________ Date: ____________ Movements: ____________ Start time: ____________ [name] time: ____________ Date: ____________ Movements: ____________ Start time: ____________ [name] time: ____________ Date: ____________ Movements: ____________ Start time: ____________ [name] time:  ____________ Date: ____________ Movements: ____________ Start time: ____________ [name] time: ____________ Date: ____________ Movements: ____________ Start time: ____________ [name] time: ____________  Date: ____________ Movements: ____________ Start time: ____________ [name] time: ____________ Date: ____________ Movements: ____________ Start time: ____________ [name] time: ____________ Date: ____________ Movements: ____________ Start time: ____________ [name] time: ____________ Date: ____________ Movements: ____________ Start time: ____________ [name] time: ____________ Date: ____________ Movements: ____________ Start time: ____________ [name] time: ____________ Date: ____________ Movements: ____________ Start time: ____________ [name] time: ____________ Date: ____________ Movements: ____________ Start time: ____________ [name] time: ____________  Date: ____________ Movements: ____________ Start time: ____________ [name] time: ____________ Date: ____________ Movements: ____________ Start time: ____________ [name] time: ____________ Date: ____________ Movements: ____________ Start time: ____________ [name] time: ____________ Date: ____________ Movements: ____________ Start time: ____________ [name] time: ____________ Date: ____________ Movements: ____________ Start time: ____________ [name] time: ____________ Date: ____________ Movements: ____________ Start time: ____________ [name] time: ____________ Date: ____________ Movements: ____________ Start time: ____________ [name] time: ____________  Date: ____________ Movements: ____________ Start time: ____________ [name] time: ____________ Date: ____________ Movements: ____________ Start time: ____________ [name] time: ____________ Date: ____________ Movements: ____________ Start time: ____________ [name] time: ____________ Date: ____________ Movements: ____________ Start time: ____________ [name] time: ____________ Date: ____________ Movements:  ____________ Start  time: ____________ [name] time: ____________ Date: ____________ Movements: ____________ Start time: ____________ [name] time: ____________ Date: ____________ Movements: ____________ Start time: ____________ [name] time: ____________  Date: ____________ Movements: ____________ Start time: ____________ [name] time: ____________ Date: ____________ Movements: ____________ Start time: ____________ [name] time: ____________ Date: ____________ Movements: ____________ Start time: ____________ [name] time: ____________ Date: ____________ Movements: ____________ Start time: ____________ [name] time: ____________ Date: ____________ Movements: ____________ Start time: ____________ [name] time: ____________ Date: ____________ Movements: ____________ Start time: ____________ [name] time: ____________ Date: ____________ Movements: ____________ Start time: ____________ [name] time: ____________  Date: ____________ Movements: ____________ Start time: ____________ [name] time: ____________ Date: ____________ Movements: ____________ Start time: ____________ [name] time: ____________ Date: ____________ Movements: ____________ Start time: ____________ [name] time: ____________ Date: ____________ Movements: ____________ Start time: ____________ [name] time: ____________ Date: ____________ Movements: ____________ Start time: ____________ [name] time: ____________ Date: ____________ Movements: ____________ Start time: ____________ [name] time: ____________ Date: ____________ Movements: ____________ Start time: ____________ [name] time: ____________  Date: ____________ Movements: ____________ Start time: ____________ [name] time: ____________ Date: ____________ Movements: ____________ Start time: ____________ [name] time: ____________ Date: ____________ Movements: ____________ Start time: ____________ [name] time: ____________ Date: ____________ Movements: ____________ Start time: ____________ [name]  time: ____________ Date: ____________ Movements: ____________ Start time: ____________ [name] time: ____________ Date: ____________ Movements: ____________ Start time: ____________ [name] time: ____________ Document Released: 08/02/2006 Document Revised: 11/17/2013 Document Reviewed: 04/29/2012 [employer] Patient Information 2015 [employer]. This information is not intended to replace advice given to you by your health care provider. Make sure you discuss any questions you have with your health care provider. Pelvic Rest Pelvic rest is sometimes recommended for women when:   The placenta is partially or completely covering the opening of the cervix (placenta previa).  There is bleeding between the uterine wall and the amniotic sac in the first trimester (subchorionic hemorrhage).  The cervix begins to open without labor starting (incompetent cervix, cervical insufficiency).  The labor is too early (preterm labor). HOME CARE INSTRUCTIONS  Do not have sexual intercourse, stimulation, or an orgasm.  Do not use tampons, douche, or put anything in the vagina.  Do not lift anything over 10 pounds (4.5 kg).  Avoid strenuous activity or straining your pelvic muscles. SEEK MEDICAL CARE IF:  You have any vaginal bleeding during pregnancy. Treat this as a potential emergency.  You have cramping pain felt low in the stomach (stronger than menstrual cramps).  You notice vaginal discharge (watery, mucus, or bloody).  You have a low, dull backache.  There are regular contractions or uterine tightening. SEEK IMMEDIATE MEDICAL CARE IF: You have vaginal bleeding and have placenta previa.  Document Released: 10/28/2010 Document Revised: 09/25/2011 Document Reviewed: 10/28/2010 [facility] Patient Information 2015 [place], [place]. This information is not intended to replace advice given to you by your health care provider. Make sure you discuss any questions you have with your health care  provider.

## 2014-04-03 NOTE — Progress Notes (Signed)
S: 31.5/7 weeks Placenta previa - marginal. Bleeding 9/12, none since. Denies ctx/abdominal cramping/vaginal bleeding  O: VSS Afebrile Lungs clear to A Cor RRR Abd: gravid soft nontender Pad none Extr no edema or calf tenderness  Tracing: baseline 130 (+) accel 145-150  No ctx  A/P: Placenta previa, now marginal previa, s/p 2nd bleed but none since admission. Stable. D/w MFM, Dr Sherrie George, sono reviewed from admission day, small [facility], Ok to discharge home since no further bleeding.  Pt will f/up in office in 10 ds for visit and sono. Pelvic and activity restrictions.  GBS cx (+)  V.Jovanny Stephanie, MD

## 2014-05-03 ENCOUNTER — Encounter (HOSPITAL_COMMUNITY): Payer: BC Managed Care – PPO | Admitting: Anesthesiology

## 2014-05-03 ENCOUNTER — Encounter (HOSPITAL_COMMUNITY)
Admission: AD | Disposition: A | Discharge status: Home / Self Care | Payer: Self-pay | Source: Ambulatory Visit | Attending: Obstetrics and Gynecology

## 2014-05-03 ENCOUNTER — Inpatient Hospital Stay (HOSPITAL_COMMUNITY): Payer: BC Managed Care – PPO | Admitting: Anesthesiology

## 2014-05-03 ENCOUNTER — Inpatient Hospital Stay (HOSPITAL_COMMUNITY)
Admission: AD | Admit: 2014-05-03 | Discharge: 2014-05-06 | DRG: 765 | Disposition: A | Discharge status: Home / Self Care | Payer: BC Managed Care – PPO | Source: Ambulatory Visit | Attending: Obstetrics and Gynecology | Admitting: Obstetrics and Gynecology

## 2014-05-03 ENCOUNTER — Encounter (HOSPITAL_COMMUNITY): Payer: Self-pay | Admitting: *Deleted

## 2014-05-03 DIAGNOSIS — O4413 Placenta previa with hemorrhage, third trimester: Principal | ICD-10-CM | POA: Diagnosis present

## 2014-05-03 DIAGNOSIS — Z302 Encounter for sterilization: Secondary | ICD-10-CM

## 2014-05-03 DIAGNOSIS — D62 Acute posthemorrhagic anemia: Secondary | ICD-10-CM | POA: Diagnosis present

## 2014-05-03 DIAGNOSIS — O99824 Streptococcus B carrier state complicating childbirth: Secondary | ICD-10-CM | POA: Diagnosis present

## 2014-05-03 DIAGNOSIS — Z9851 Tubal ligation status: Secondary | ICD-10-CM

## 2014-05-03 DIAGNOSIS — O9902 Anemia complicating childbirth: Secondary | ICD-10-CM | POA: Diagnosis present

## 2014-05-03 DIAGNOSIS — Z87891 Personal history of nicotine dependence: Secondary | ICD-10-CM | POA: Diagnosis not present

## 2014-05-03 DIAGNOSIS — Z3A36 36 weeks gestation of pregnancy: Secondary | ICD-10-CM | POA: Diagnosis present

## 2014-05-03 HISTORY — DX: Tubal ligation status: Z98.51

## 2014-05-03 HISTORY — DX: Acute posthemorrhagic anemia: D62

## 2014-05-03 LAB — CBC
HCT: 32.5 % — ABNORMAL LOW (ref 36.0–46.0)
Hemoglobin: 11.5 g/dL — ABNORMAL LOW (ref 12.0–15.0)
MCH: 30.5 pg (ref 26.0–34.0)
MCHC: 35.4 g/dL (ref 30.0–36.0)
MCV: 86.2 fL (ref 78.0–100.0)
Platelets: 232 10*3/uL (ref 150–400)
RBC: 3.77 MIL/uL — ABNORMAL LOW (ref 3.87–5.11)
RDW: 12.7 % (ref 11.5–15.5)
WBC: 8.4 10*3/uL (ref 4.0–10.5)

## 2014-05-03 SURGERY — Surgical Case
Anesthesia: Spinal | Site: Abdomen

## 2014-05-03 MED ORDER — SODIUM CHLORIDE 0.9 % IJ SOLN: 3.0000 mL | INTRAMUSCULAR | Status: DC | PRN

## 2014-05-03 MED ORDER — MORPHINE SULFATE 0.5 MG/ML IJ SOLN
INTRAMUSCULAR | Status: AC
  Filled 2014-05-03: qty 10

## 2014-05-03 MED ORDER — DIPHENHYDRAMINE HCL 25 MG PO CAPS
25.0000 mg | ORAL_CAPSULE | ORAL | Status: DC | PRN
  Administered 2014-05-04: 25 mg via ORAL
  Filled 2014-05-03: qty 1

## 2014-05-03 MED ORDER — FERROUS SULFATE 325 (65 FE) MG PO TABS
325.0000 mg | ORAL_TABLET | Freq: Two times a day (BID) | ORAL | Status: DC
  Administered 2014-05-04 – 2014-05-06 (×5): 325 mg via ORAL
  Filled 2014-05-03 (×5): qty 1

## 2014-05-03 MED ORDER — BUPIVACAINE IN DEXTROSE 0.75-8.25 % IT SOLN
INTRATHECAL | Status: DC | PRN
  Administered 2014-05-03: 1.4 mL via INTRATHECAL

## 2014-05-03 MED ORDER — NALBUPHINE HCL 10 MG/ML IJ SOLN: 5.0000 mg | Freq: Once | INTRAMUSCULAR | Status: AC | PRN

## 2014-05-03 MED ORDER — 0.9 % SODIUM CHLORIDE (POUR BTL) OPTIME
TOPICAL | Status: DC | PRN
  Administered 2014-05-03: 1000 mL

## 2014-05-03 MED ORDER — CEFAZOLIN SODIUM-DEXTROSE 2-3 GM-% IV SOLR
INTRAVENOUS | Status: AC
  Filled 2014-05-03: qty 50

## 2014-05-03 MED ORDER — OXYCODONE-ACETAMINOPHEN 5-325 MG PO TABS
1.0000 | ORAL_TABLET | ORAL | Status: DC | PRN
Start: 2014-05-03 — End: 2014-05-06
  Administered 2014-05-04 – 2014-05-06 (×7): 1 via ORAL
  Filled 2014-05-03 (×9): qty 1

## 2014-05-03 MED ORDER — DIPHENHYDRAMINE HCL 25 MG PO CAPS
25.0000 mg | ORAL_CAPSULE | Freq: Four times a day (QID) | ORAL | Status: DC | PRN
  Filled 2014-05-03: qty 1

## 2014-05-03 MED ORDER — LACTATED RINGERS IV SOLN
INTRAVENOUS | Status: DC | PRN
  Administered 2014-05-03 (×3): via INTRAVENOUS

## 2014-05-03 MED ORDER — FENTANYL CITRATE 0.05 MG/ML IJ SOLN: 25.0000 ug | INTRAMUSCULAR | Status: DC | PRN

## 2014-05-03 MED ORDER — SODIUM CHLORIDE 0.9 % IV SOLN: Freq: Once | INTRAVENOUS | Status: DC

## 2014-05-03 MED ORDER — ONDANSETRON HCL 4 MG/2ML IJ SOLN: 4.0000 mg | INTRAMUSCULAR | Status: DC | PRN

## 2014-05-03 MED ORDER — SODIUM CHLORIDE 0.9 % IV SOLN: 250.0000 mL | INTRAVENOUS | Status: DC

## 2014-05-03 MED ORDER — SCOPOLAMINE 1 MG/3DAYS TD PT72
1.0000 | MEDICATED_PATCH | Freq: Once | TRANSDERMAL | Status: AC
  Administered 2014-05-03: 1.5 mg via TRANSDERMAL

## 2014-05-03 MED ORDER — NALBUPHINE HCL 10 MG/ML IJ SOLN: 5.0000 mg | INTRAMUSCULAR | Status: DC | PRN

## 2014-05-03 MED ORDER — ZOLPIDEM TARTRATE 5 MG PO TABS: 5.0000 mg | ORAL_TABLET | Freq: Every evening | ORAL | Status: DC | PRN

## 2014-05-03 MED ORDER — OXYTOCIN 40 UNITS IN LACTATED RINGERS INFUSION - SIMPLE MED: 62.5000 mL/h | INTRAVENOUS | Status: AC

## 2014-05-03 MED ORDER — OXYTOCIN 10 UNIT/ML IJ SOLN
40.0000 [IU] | INTRAVENOUS | Status: DC | PRN
  Administered 2014-05-03: 40 [IU] via INTRAVENOUS

## 2014-05-03 MED ORDER — FLEET ENEMA 7-19 GM/118ML RE ENEM: 1.0000 | ENEMA | Freq: Every day | RECTAL | Status: DC | PRN

## 2014-05-03 MED ORDER — ONDANSETRON HCL 4 MG/2ML IJ SOLN
INTRAMUSCULAR | Status: DC | PRN
  Administered 2014-05-03: 4 mg via INTRAVENOUS

## 2014-05-03 MED ORDER — DIBUCAINE 1 % RE OINT: 1.0000 "application " | TOPICAL_OINTMENT | RECTAL | Status: DC | PRN

## 2014-05-03 MED ORDER — WITCH HAZEL-GLYCERIN EX PADS: 1.0000 "application " | MEDICATED_PAD | CUTANEOUS | Status: DC | PRN

## 2014-05-03 MED ORDER — SIMETHICONE 80 MG PO CHEW: 80.0000 mg | CHEWABLE_TABLET | ORAL | Status: DC | PRN

## 2014-05-03 MED ORDER — ONDANSETRON HCL 4 MG/2ML IJ SOLN
INTRAMUSCULAR | Status: AC
  Filled 2014-05-03: qty 2

## 2014-05-03 MED ORDER — METHYLERGONOVINE MALEATE 0.2 MG PO TABS: 0.2000 mg | ORAL_TABLET | ORAL | Status: DC | PRN

## 2014-05-03 MED ORDER — NALBUPHINE HCL 10 MG/ML IJ SOLN
5.0000 mg | INTRAMUSCULAR | Status: DC | PRN
  Administered 2014-05-04: 5 mg via INTRAVENOUS
  Filled 2014-05-03: qty 1

## 2014-05-03 MED ORDER — EPHEDRINE SULFATE 50 MG/ML IJ SOLN
INTRAMUSCULAR | Status: DC | PRN
  Administered 2014-05-03: 10 mg via INTRAVENOUS

## 2014-05-03 MED ORDER — MEPERIDINE HCL 25 MG/ML IJ SOLN: 6.2500 mg | INTRAMUSCULAR | Status: DC | PRN

## 2014-05-03 MED ORDER — BUPIVACAINE HCL (PF) 0.25 % IJ SOLN
INTRAMUSCULAR | Status: DC | PRN
  Administered 2014-05-03: 8 mL via PERINEURAL

## 2014-05-03 MED ORDER — MENTHOL 3 MG MT LOZG: 1.0000 | LOZENGE | OROMUCOSAL | Status: DC | PRN

## 2014-05-03 MED ORDER — SCOPOLAMINE 1 MG/3DAYS TD PT72
MEDICATED_PATCH | TRANSDERMAL | Status: AC
  Filled 2014-05-03: qty 1

## 2014-05-03 MED ORDER — ONDANSETRON HCL 4 MG PO TABS: 4.0000 mg | ORAL_TABLET | ORAL | Status: DC | PRN

## 2014-05-03 MED ORDER — LACTATED RINGERS IV SOLN
INTRAVENOUS | Status: DC
Start: 2014-05-03 — End: 2014-05-03

## 2014-05-03 MED ORDER — SODIUM CHLORIDE 0.9 % IJ SOLN: 3.0000 mL | Freq: Two times a day (BID) | INTRAMUSCULAR | Status: DC

## 2014-05-03 MED ORDER — BUPIVACAINE-EPINEPHRINE (PF) 0.25% -1:200000 IJ SOLN
INTRAMUSCULAR | Status: AC
  Filled 2014-05-03: qty 30

## 2014-05-03 MED ORDER — METHYLERGONOVINE MALEATE 0.2 MG/ML IJ SOLN: 0.2000 mg | INTRAMUSCULAR | Status: DC | PRN

## 2014-05-03 MED ORDER — KETOROLAC TROMETHAMINE 30 MG/ML IJ SOLN
30.0000 mg | Freq: Four times a day (QID) | INTRAMUSCULAR | Status: AC | PRN
  Administered 2014-05-03: 30 mg via INTRAMUSCULAR

## 2014-05-03 MED ORDER — OXYCODONE-ACETAMINOPHEN 5-325 MG PO TABS
2.0000 | ORAL_TABLET | ORAL | Status: DC | PRN
  Administered 2014-05-04 – 2014-05-06 (×2): 2 via ORAL
  Filled 2014-05-03: qty 2

## 2014-05-03 MED ORDER — BISACODYL 10 MG RE SUPP: 10.0000 mg | Freq: Every day | RECTAL | Status: DC | PRN

## 2014-05-03 MED ORDER — IBUPROFEN 600 MG PO TABS
600.0000 mg | ORAL_TABLET | Freq: Four times a day (QID) | ORAL | Status: DC
  Administered 2014-05-04 – 2014-05-06 (×11): 600 mg via ORAL
  Filled 2014-05-03 (×11): qty 1

## 2014-05-03 MED ORDER — NALOXONE HCL 1 MG/ML IJ SOLN: 1.0000 ug/kg/h | INTRAVENOUS | Status: DC | PRN

## 2014-05-03 MED ORDER — PRENATAL MULTIVITAMIN CH
1.0000 | ORAL_TABLET | Freq: Every day | ORAL | Status: DC
  Administered 2014-05-04 – 2014-05-06 (×3): 1 via ORAL
  Filled 2014-05-03 (×3): qty 1

## 2014-05-03 MED ORDER — CEFAZOLIN SODIUM-DEXTROSE 2-3 GM-% IV SOLR
INTRAVENOUS | Status: DC | PRN
  Administered 2014-05-03: 2 g via INTRAVENOUS

## 2014-05-03 MED ORDER — FENTANYL CITRATE 0.05 MG/ML IJ SOLN
INTRAMUSCULAR | Status: AC
  Filled 2014-05-03: qty 2

## 2014-05-03 MED ORDER — ONDANSETRON HCL 4 MG/2ML IJ SOLN: 4.0000 mg | Freq: Three times a day (TID) | INTRAMUSCULAR | Status: DC | PRN

## 2014-05-03 MED ORDER — TETANUS-DIPHTH-ACELL PERTUSSIS 5-2.5-18.5 LF-MCG/0.5 IM SUSP: 0.5000 mL | Freq: Once | INTRAMUSCULAR | Status: DC

## 2014-05-03 MED ORDER — FAMOTIDINE IN NACL 20-0.9 MG/50ML-% IV SOLN
20.0000 mg | Freq: Once | INTRAVENOUS | Status: AC
  Administered 2014-05-03: 20 mg via INTRAVENOUS
  Filled 2014-05-03: qty 50

## 2014-05-03 MED ORDER — METHYLERGONOVINE MALEATE 0.2 MG/ML IJ SOLN
INTRAMUSCULAR | Status: DC | PRN
  Administered 2014-05-03: 0.2 mg via INTRAMUSCULAR

## 2014-05-03 MED ORDER — MORPHINE SULFATE (PF) 0.5 MG/ML IJ SOLN
INTRAMUSCULAR | Status: DC | PRN
  Administered 2014-05-03: .1 mg via INTRATHECAL

## 2014-05-03 MED ORDER — PHENYLEPHRINE 8 MG IN D5W 100 ML (0.08MG/ML) PREMIX OPTIME
INJECTION | INTRAVENOUS | Status: DC | PRN
  Administered 2014-05-03: 60 ug/min via INTRAVENOUS

## 2014-05-03 MED ORDER — SENNOSIDES-DOCUSATE SODIUM 8.6-50 MG PO TABS
2.0000 | ORAL_TABLET | ORAL | Status: DC
  Administered 2014-05-04 – 2014-05-06 (×3): 2 via ORAL
  Filled 2014-05-03 (×3): qty 2

## 2014-05-03 MED ORDER — NALOXONE HCL 0.4 MG/ML IJ SOLN: 0.4000 mg | INTRAMUSCULAR | Status: DC | PRN

## 2014-05-03 MED ORDER — KETOROLAC TROMETHAMINE 30 MG/ML IJ SOLN: 30.0000 mg | Freq: Four times a day (QID) | INTRAMUSCULAR | Status: AC | PRN

## 2014-05-03 MED ORDER — LANOLIN HYDROUS EX OINT: 1.0000 "application " | TOPICAL_OINTMENT | CUTANEOUS | Status: DC | PRN

## 2014-05-03 MED ORDER — SIMETHICONE 80 MG PO CHEW
80.0000 mg | CHEWABLE_TABLET | Freq: Three times a day (TID) | ORAL | Status: DC
  Administered 2014-05-04 – 2014-05-06 (×7): 80 mg via ORAL
  Filled 2014-05-03 (×7): qty 1

## 2014-05-03 MED ORDER — DEXTROSE IN LACTATED RINGERS 5 % IV SOLN
INTRAVENOUS | Status: DC
  Administered 2014-05-04: 01:00:00 via INTRAVENOUS

## 2014-05-03 MED ORDER — CITRIC ACID-SODIUM CITRATE 334-500 MG/5ML PO SOLN
30.0000 mL | Freq: Once | ORAL | Status: AC
  Administered 2014-05-03: 30 mL via ORAL
  Filled 2014-05-03: qty 15

## 2014-05-03 MED ORDER — BUPIVACAINE HCL (PF) 0.25 % IJ SOLN
INTRAMUSCULAR | Status: AC
  Filled 2014-05-03: qty 30

## 2014-05-03 MED ORDER — FENTANYL CITRATE 0.05 MG/ML IJ SOLN
INTRAMUSCULAR | Status: DC | PRN
  Administered 2014-05-03: 12.5 ug via INTRATHECAL

## 2014-05-03 MED ORDER — SIMETHICONE 80 MG PO CHEW
80.0000 mg | CHEWABLE_TABLET | ORAL | Status: DC
  Administered 2014-05-04 – 2014-05-06 (×3): 80 mg via ORAL
  Filled 2014-05-03 (×3): qty 1

## 2014-05-03 MED ORDER — OXYTOCIN 10 UNIT/ML IJ SOLN
INTRAMUSCULAR | Status: AC
  Filled 2014-05-03: qty 4

## 2014-05-03 MED ORDER — METHYLPREDNISOLONE SODIUM SUCC 125 MG IJ SOLR
INTRAMUSCULAR | Status: AC
  Filled 2014-05-03: qty 2

## 2014-05-03 MED ORDER — DIPHENHYDRAMINE HCL 50 MG/ML IJ SOLN: 12.5000 mg | INTRAMUSCULAR | Status: DC | PRN

## 2014-05-03 MED ORDER — KETOROLAC TROMETHAMINE 30 MG/ML IJ SOLN
INTRAMUSCULAR | Status: AC
  Filled 2014-05-03: qty 1

## 2014-05-03 SURGICAL SUPPLY — 49 items
APL SKNCLS STERI-STRIP NONHPOA (GAUZE/BANDAGES/DRESSINGS)
BARRIER ADHS 3X4 INTERCEED (GAUZE/BANDAGES/DRESSINGS) ×2 IMPLANT
BENZOIN TINCTURE PRP APPL 2/3 (GAUZE/BANDAGES/DRESSINGS) IMPLANT
BRR ADH 4X3 ABS CNTRL BYND (GAUZE/BANDAGES/DRESSINGS) ×1
CLAMP CORD UMBIL (MISCELLANEOUS) ×1 IMPLANT
CLOTH BEACON ORANGE TIMEOUT ST (SAFETY) ×2 IMPLANT
CONTAINER PREFILL 10% NBF 15ML (MISCELLANEOUS) ×2 IMPLANT
DRAPE SHEET LG 3/4 BI-LAMINATE (DRAPES) ×1 IMPLANT
DRSG OPSITE POSTOP 4X10 (GAUZE/BANDAGES/DRESSINGS) ×2 IMPLANT
DURAPREP 26ML APPLICATOR (WOUND CARE) ×2 IMPLANT
ELECT REM PT RETURN 9FT ADLT (ELECTROSURGICAL) ×2
ELECTRODE REM PT RTRN 9FT ADLT (ELECTROSURGICAL) ×1 IMPLANT
EXTRACTOR VACUUM M CUP 4 TUBE (SUCTIONS) IMPLANT
GLOVE BIOGEL PI IND STRL 7.0 (GLOVE) ×1 IMPLANT
GLOVE BIOGEL PI INDICATOR 7.0 (GLOVE) ×2
GLOVE ECLIPSE 6.5 STRL STRAW (GLOVE) ×3 IMPLANT
GOWN STRL REUS W/TWL LRG LVL3 (GOWN DISPOSABLE) ×5 IMPLANT
GOWN SURGICAL LARGE (GOWNS) ×1 IMPLANT
KIT ABG SYR 3ML LUER SLIP (SYRINGE) IMPLANT
NDL FILTER BLUNT 18X1 1/2 (NEEDLE) IMPLANT
NDL HYPO 25X1 1.5 SAFETY (NEEDLE) ×1 IMPLANT
NDL HYPO 25X5/8 SAFETYGLIDE (NEEDLE) IMPLANT
NEEDLE FILTER BLUNT 18X 1/2SAF (NEEDLE) ×1
NEEDLE FILTER BLUNT 18X1 1/2 (NEEDLE) ×1 IMPLANT
NEEDLE HYPO 22GX1.5 SAFETY (NEEDLE) ×1 IMPLANT
NEEDLE HYPO 25X1 1.5 SAFETY (NEEDLE) ×2 IMPLANT
NEEDLE HYPO 25X5/8 SAFETYGLIDE (NEEDLE) IMPLANT
NS IRRIG 1000ML POUR BTL (IV SOLUTION) ×2 IMPLANT
PACK C SECTION WH (CUSTOM PROCEDURE TRAY) ×2 IMPLANT
PAD OB MATERNITY 4.3X12.25 (PERSONAL CARE ITEMS) ×2 IMPLANT
RTRCTR C-SECT PINK 25CM LRG (MISCELLANEOUS) IMPLANT
STAPLER VISISTAT 35W (STAPLE) ×1 IMPLANT
STRIP CLOSURE SKIN 1/2X4 (GAUZE/BANDAGES/DRESSINGS) IMPLANT
SUT CHROMIC GUT AB #0 18 (SUTURE) ×1 IMPLANT
SUT MNCRL 0 VIOLET CTX 36 (SUTURE) ×3 IMPLANT
SUT MON AB 4-0 PS1 27 (SUTURE) IMPLANT
SUT MONOCRYL 0 CTX 36 (SUTURE) ×3
SUT PLAIN 2 0 (SUTURE)
SUT PLAIN 2 0 XLH (SUTURE) ×1 IMPLANT
SUT PLAIN ABS 2-0 CT1 27XMFL (SUTURE) IMPLANT
SUT VIC AB 0 CT1 36 (SUTURE) ×4 IMPLANT
SUT VIC AB 2-0 CT1 27 (SUTURE) ×2
SUT VIC AB 2-0 CT1 TAPERPNT 27 (SUTURE) ×1 IMPLANT
SUT VIC AB 4-0 PS2 27 (SUTURE) IMPLANT
SYR CONTROL 10ML LL (SYRINGE) ×2 IMPLANT
TOWEL OR 17X24 6PK STRL BLUE (TOWEL DISPOSABLE) ×2 IMPLANT
TRAY FOLEY BAG SILVER LF 16FR (CATHETERS) ×1 IMPLANT
TRAY FOLEY CATH 14FR (SET/KITS/TRAYS/PACK) IMPLANT
WATER STERILE IRR 1000ML POUR (IV SOLUTION) ×2 IMPLANT

## 2014-05-03 NOTE — Anesthesia Postprocedure Evaluation (Signed)
  Anesthesia Post-op Note  Patient: [name]  Procedure(s) Performed: Procedure(s) (LRB): CESAREAN SECTION (N/A)  Patient Location: PACU  Anesthesia Type: Spinal  Level of Consciousness: awake and alert   Airway and Oxygen Therapy: Patient Spontanous Breathing  Post-op Pain: mild  Post-op Assessment: Post-op Vital signs reviewed, Patient's Cardiovascular Status Stable, Respiratory Function Stable, Patent Airway and No signs of Nausea or vomiting  Last Vitals:  Filed Vitals:   05/03/14 1640  BP: 115/60  Pulse: 89  Temp: 36.7 C  Resp: 19    Post-op Vital Signs: stable   Complications: No apparent anesthesia complications

## 2014-05-03 NOTE — Anesthesia Procedure Notes (Signed)
Spinal  Patient location during procedure: OR Staffing Anesthesiologist: Montez Hageman Performed by: anesthesiologist  Preanesthetic Checklist Completed: patient identified, site marked, surgical consent, pre-op evaluation, timeout performed, IV checked, risks and benefits discussed and monitors and equipment checked Spinal Block Patient position: sitting Prep: ChloraPrep Patient monitoring: heart rate, continuous pulse ox and blood pressure Approach: midline Injection technique: single-shot Needle Needle type: Sprotte  Needle gauge: 24 G Needle length: 9 cm Additional Notes Expiration date of kit checked and confirmed. Patient tolerated procedure well, without complications.

## 2014-05-03 NOTE — MAU Note (Signed)
Started bleeding at 1100 has gotten worse since.

## 2014-05-03 NOTE — Lactation Note (Signed)
This note was copied from the chart of [name]. Lactation Consultation Note  Patient Name: [name] VHQIO'NToday's Date: 05/03/2014 Reason for consult: Initial assessment;NICU baby;Late preterm infant RN set up DEBP for Mom to use. LC stressed importance of pumping every 3 hours for 15 minutes on Preemie setting. Reviewed storage guidelines. Mom has NICU booklet as well as BF Brochure. Encouraged STS when able. Encouraged hand expression with pumping. Advised to call for questions/concerns.   Maternal Data    Feeding    LATCH Score/Interventions                      Lactation Tools Discussed/Used Tools: Pump Breast pump type: Double-Electric Breast Pump   Consult Status Consult Status: Follow-up Date: 05/04/14 Follow-up type: In-patient    Alfred LevinsGranger, Mahum Betten Ann 05/03/2014, 6:18 PM

## 2014-05-03 NOTE — H&P (Signed)
[name] is a 28 y.o. female  G2P1001 MWF @ 36 weeks with known previa presenting evaluation and management of third episode of vaginal bleeding. Pt is BMZ complete and has had magnesium sulfate( see prior admit). GBS cx (+). Request sterilization  Maternal Medical History:  Reason for admission: Vaginal bleeding.   Fetal activity: Perceived fetal activity is normal.    Prenatal complications: Placental abnormality.   previa  Prenatal Complications - Diabetes: none.    OB History   Grav Para Term Preterm Abortions TAB SAB Ect Mult Living   2 1 1       1      Past Medical History  Diagnosis Date  . Postpartum care following vaginal delivery (10/7) 04/23/2013  . Placenta previa    Past Surgical History  Procedure Laterality Date  . Muscle repair      right groin   Family History: family history includes Cancer in her maternal grandmother, mother, paternal aunt, and paternal grandmother. Social History:  reports that she has quit smoking. Her smoking use included Cigarettes. She has a 1.5 pack-year smoking history. She has never used smokeless tobacco. She reports that she does not drink alcohol or use illicit drugs.   Prenatal Transfer Tool  Maternal Diabetes: No Genetic Screening: Normal Maternal Ultrasounds/Referrals: Normal Fetal Ultrasounds or other Referrals:  None Maternal Substance Abuse:  No Significant Maternal Medications:  None Significant Maternal Lab Results:  Lab values include: Group B Strep positive Other Comments:  s/p magnesium sulfate for neuroprophylaxis, BMZ  Review of Systems  All other systems reviewed and are negative.     not currently breastfeeding. Maternal Exam:  Abdomen: Patient reports no abdominal tenderness. Estimated fetal weight is 6 1/2 lb.   Fetal presentation: shoulder  Introitus: Normal vulva. Amniotic fluid character: not assessed.     Physical Exam  Constitutional: She is oriented to person, place, and time. She  appears well-developed and well-nourished.  HENT:  Head: Atraumatic.  Eyes: EOM are normal.  Neck: Neck supple.  Cardiovascular: Regular rhythm.   Respiratory: Effort normal.  GI: Soft.  Musculoskeletal: She exhibits no edema.  Neurological: She is alert and oriented to person, place, and time.  Skin: Skin is warm and dry.  Psychiatric: She has a normal mood and affect.   VE: deferred. Pad: bleeding Prenatal labs: ABO, Rh: --/--/A POS (09/15 1631) Antibody: NEG (09/15 1631) Rubella: Immune (05/06 0000) RPR: Nonreactive (05/06 0000)  HBsAg: Negative (05/06 0000)  HIV: Non-reactive (05/06 0000)  GBS:   positive  Assessment/Plan: Third trimester vaginal bleeding due to placenta previa IUP @ 36 weeks Desires sterilization P) primary C/S, BTL. Risk of surgery reviewed including poss need for blood transfusion and its risk, internal scar tissue, infection, bleeding, poss hysterectomy due to acdreta, injury to surrounding organ structures, permanent sterilization, 1/300 failure rate for TL, nonreversible. T& C Consents signed. ALL? Answered. OR and anesthesia aware Granvel Proudfoot A 05/03/2014, 12:08 PM

## 2014-05-03 NOTE — Progress Notes (Signed)
Notified pt is here, Charity fundraiserN to call WestlakeBailey, CNM.

## 2014-05-03 NOTE — Transfer of Care (Signed)
Immediate Anesthesia Transfer of Care Note  Patient: [name]  Procedure(s) Performed: Procedure(s): CESAREAN SECTION (N/A)  Patient Location: PACU  Anesthesia Type:Spinal  Level of Consciousness: awake and alert   Airway & Oxygen Therapy: Patient Spontanous Breathing  Post-op Assessment: Report given to PACU RN and Post -op Vital signs reviewed and stable  Post vital signs: Reviewed and stable  Complications: No apparent anesthesia complications

## 2014-05-03 NOTE — Brief Op Note (Signed)
05/03/2014  1:53 PM  PATIENT:  [name]  28 y.o. female  PRE-OPERATIVE DIAGNOSIS:  Third trimester vaginal bleeding , Placenta  Previa,  IUP@ 36 weeks. Desires sterilization  POST-OPERATIVE DIAGNOSIS:  same  PROCEDURE:  Primary Cesarean section, kerr hysterotomy, Modified P[place]oy Tubal Ligation  SURGEON:  Surgeon(s) and Role:    * Ariyanah Aguado Cathie BeamsA Aalyiah Camberos, MD - Primary  PHYSICIAN ASSISTANT:   ASSISTANTS: Marlinda Mikeanya bailey, CNM   ANESTHESIA:   spinal Findings:  Ant placenta, transverse back up live female, nl tubes and ovaries Apgar 5/8 EBL:  Total I/O In: 2400 [I.V.:2400] Out: 1100 [Urine:400; Blood:700]  BLOOD ADMINISTERED:none  DRAINS: none   LOCAL MEDICATIONS USED:  MARCAINE     SPECIMEN:  Source of Specimen:  placenta, portion of right and left tube  DISPOSITION OF SPECIMEN:  PATHOLOGY  COUNTS:  YES  TOURNIQUET:  * No tourniquets in log *  DICTATION: .Other Dictation: Dictation Number [id number]  PLAN OF CARE: Admit to inpatient   PATIENT DISPOSITION:  PACU - hemodynamically stable.   Delay start of Pharmacological VTE agent (>24hrs) due to surgical blood loss or risk of bleeding: no

## 2014-05-03 NOTE — Op Note (Signed)
NAMMila Melton:  Melton, [name]               ACCOUNT NO.:  [id number]  MEDICAL RECORD NO.:  [id number]  LOCATION:  [facility]                          [facility]:  [facility]  PHYSICIAN:  [name], M.D.DATE OF BIRTH:  Mar 02, 1986  DATE OF PROCEDURE:  05/03/2014 DATE OF DISCHARGE:                              OPERATIVE REPORT   PREOPERATIVE DIAGNOSES:  third trimester vaginal bleeding, placenta previa, intrauterine gestation at 5836 weeks, desires sterilization.  PROCEDURES: 1. Primary cesarean section, Kerr hysterotomy. 2. Modified Pomeroy tubal ligation.  POSTOPERATIVE DIAGNOSES:  third trimester vaginal bleeding, placenta previa, intrauterine gestation at 36 weeks, desires sterilization.  ANESTHESIA:  Spinal.  SURGEON:  [name], M.D.  ASSISTANT:  [name], CNM.  DESCRIPTION OF PROCEDURE:  Under adequate spinal anesthesia, the patient was placed in a supine position with a left lateral tilt.  She was sterilely prepped and draped in usual fashion.  An indwelling Foley catheter was sterilely placed.  A 0.25% Marcaine was injected along the planned Pfannenstiel skin incision.  Pfannenstiel skin incision was then made and carried down to the rectus fascia.  The rectus fascia was then opened transversely.  Rectus fascia was then bluntly and sharply dissected off the rectus muscle in superior and inferior fashion.  The rectus muscle split in midline.  The parietal peritoneum was entered bluntly and extended.  The lower uterine segment was notable for multiple tortuous vessels, nonetheless, the vesicouterine peritoneum was carefully opened transversely.  The bladder was then bluntly dissected off the lower uterine segment and displaced inferiorly with a bladder retractor.  A curvilinear low transverse uterine incision was then carefully made and extended with bandage scissors.  Copious bleeding was noted as the placenta was anterior.  Through the placenta, the baby was identified,  was transversed back up with the head to the maternal left. The foot was grasped with hand and stabilized before rupturing of the membranes.  The other foot was undescended and was finally located, brought out, the baby was then delivered in the usual breech maneuver. The baby was bulb suctioned in the abdomen.  Cord was clamped and cut. The baby was transferred to the awaiting pediatricians assigned, [name].  Placenta was removed, intact, and sent to Pathology.  Uterine cavity was cleaned off debris.  Uterine incision had no extension.  There was a bleeding pumping vessel on the right angle.  A 0 Monocryl running locked stitch was placed in the first layer.  Second layer was imbricated using a 0 Monocryl suture.  There was a vessel that was bleeding on the right side, for which a single suture of 0 Monocryl was then utilized for hemostasis.  Small bleeding along the peritoneal edges and the lower segment was cauterized. Attention was then turned to the fallopian tubes, both of which were identified down to the fimbriated end.  The midportion of both tubes was grasped with a Babcock.  The underlying mesosalpinx was opened with cautery, proximally and distally.  The segment was tied with 0 chromic sutures x2 proximally, distally on both sides and intervening segment of both side was then removed.  The left ovary had a small baby  size ovarian tissue, but otherwise both ovaries were normal.  The abdomen was copiously irrigated and suctioned off debris.  Interceed was placed in the lower uterine segment.  The parietal peritoneum was then closed with 2-0 Vicryl.  The rectus fascia was closed with 0 Vicryl x2.  The subcutaneous area was irrigated.  Small bleeders were cauterized. Interrupted 2-0 plain sutures were placed and the skin was approximated using Ethicon staples.  SPECIMENS:  Placenta sent to Pathology as well as the portion of right and left  fallopian tubes.  ESTIMATED BLOOD LOSS:  700 mL.  INTRAOPERATIVE FLUID:  600 mL.  URINE OUTPUT:  500 mL clear yellow urine.  COUNTS:  Sponge and instrument counts x2 was correct.  COMPLICATIONS:  None.  The patient tolerated the procedure well, being transferred to recovery in stable condition.  The baby was placed skin to skin.     [name], M.D.     [place]/MEDQ  D:  05/03/2014  T:  05/03/2014  Job:  [id number]

## 2014-05-03 NOTE — Progress Notes (Signed)
Notified pt here, blood running down her legs, coming to see pt now.

## 2014-05-03 NOTE — Anesthesia Preprocedure Evaluation (Signed)
Anesthesia Evaluation  Patient identified by MRN, date of birth, ID band Patient awake    Reviewed: Allergy & Precautions, H&P , NPO status , Patient's Chart, lab work & pertinent test results  Airway Mallampati: II TM Distance: >3 FB Neck ROM: Full    Dental no notable dental hx.    Pulmonary neg pulmonary ROS, former smoker,  breath sounds clear to auscultation  Pulmonary exam normal       Cardiovascular negative cardio ROS  Rhythm:Regular Rate:Normal     Neuro/Psych negative neurological ROS  negative psych ROS   GI/Hepatic negative GI ROS, Neg liver ROS,   Endo/Other  negative endocrine ROS  Renal/GU negative Renal ROS  negative genitourinary   Musculoskeletal negative musculoskeletal ROS (+)   Abdominal   Peds negative pediatric ROS (+)  Hematology negative hematology ROS (+)   Anesthesia Other Findings   Reproductive/Obstetrics negative OB ROS (+) Pregnancy                           Anesthesia Physical Anesthesia Plan  ASA: II and emergent  Anesthesia Plan: Spinal   Post-op Pain Management:    Induction:   Airway Management Planned:   Additional Equipment:   Intra-op Plan:   Post-operative Plan:   Informed Consent: I have reviewed the patients History and Physical, chart, labs and discussed the procedure including the risks, benefits and alternatives for the proposed anesthesia with the patient or authorized representative who has indicated his/her understanding and acceptance.   Dental advisory given  Plan Discussed with: CRNA  Anesthesia Plan Comments: (Previa. Urgent. Full stomach. SAB)        Anesthesia Quick Evaluation

## 2014-05-04 ENCOUNTER — Encounter (HOSPITAL_COMMUNITY): Payer: Self-pay | Admitting: Obstetrics and Gynecology

## 2014-05-04 DIAGNOSIS — Z9851 Tubal ligation status: Secondary | ICD-10-CM

## 2014-05-04 DIAGNOSIS — D62 Acute posthemorrhagic anemia: Secondary | ICD-10-CM

## 2014-05-04 HISTORY — DX: Tubal ligation status: Z98.51

## 2014-05-04 HISTORY — DX: Acute posthemorrhagic anemia: D62

## 2014-05-04 LAB — CBC
HCT: 27.1 % — ABNORMAL LOW (ref 36.0–46.0)
Hemoglobin: 9.3 g/dL — ABNORMAL LOW (ref 12.0–15.0)
MCH: 29.9 pg (ref 26.0–34.0)
MCHC: 34.3 g/dL (ref 30.0–36.0)
MCV: 87.1 fL (ref 78.0–100.0)
PLATELETS: 203 10*3/uL (ref 150–400)
RBC: 3.11 MIL/uL — AB (ref 3.87–5.11)
RDW: 12.8 % (ref 11.5–15.5)
WBC: 10.7 10*3/uL — AB (ref 4.0–10.5)

## 2014-05-04 NOTE — Lactation Note (Signed)
This note was copied from the chart of Teresa Mila Homermily [place]. Lactation Consultation Note  Follow up visit made.  Mom is currently pumping and obtaining a few mls of colostrum.  Reviewed pump usage and schedule.  Mom states she both breastfed and pumped for 4 months with her first baby now 6312 months old.  She states she would like to do both if possible with this baby. Discussed LPT feeding norm and the importance of continuing to pump to maintain milk supply and supplement baby until he transfers milk well.  Mom has a personal DEBP at home.  Encouraged to call with concerns/assist prn.  Patient Name: [name] WUJWJ'XToday's Date: 05/04/2014     Maternal Data    Feeding    LATCH Score/Interventions                      Lactation Tools Discussed/Used     Consult Status      [name], [name] 05/04/2014, 11:27 AM

## 2014-05-04 NOTE — Progress Notes (Signed)
CSW attempted to meet with the MOB in order to introduce role of CSW in NICU team and to provide emotional support.  MOB had numerous visitors in her room.  CSW briefly introduced self.  MOB stated that she was "doing all right".  MOB expressed gratitude that she had been to "keep him inside as long as I did", due to previous complications in her pregnancy.    MOB receptive to CSW returning at a later time when visitors are not present.  CSW to follow up with the MOB prior to her discharge.  

## 2014-05-04 NOTE — Progress Notes (Signed)
Ur chart review completed.  

## 2014-05-04 NOTE — Progress Notes (Addendum)
Patient ID: [name], female   DOB: Sep 09, 1985, 28 y.o.   MRN: [id number] Subjective: POD# 1 Information for the patient's newborn:  Rodney BoozeSimmons, Boy Kamia [[id number]][[id number]]  female   / circ baby in NICU  Reports feeling a little sore, but well Feeding: breast and bottle - pumping colostrum / baby in NICU Patient reports tolerating PO.  Breast symptoms: small amounts of colostrum while pumping Pain controlled with ibuprofen (OTC) and narcotic analgesics including Percocet Denies HA/SOB/C/P/N/V/dizziness. Flatus present. No BM. She reports vaginal bleeding as normal, without clots.  She is ambulating, urinating without difficult.     Objective:   VS:  Filed Vitals:   05/03/14 2317 05/04/14 0045 05/04/14 0503 05/04/14 0947  BP:  94/51 102/49 93/52  Pulse:  66 73 67  Temp:  97.9 F (36.6 C) 97.8 F (36.6 C) 97.8 F (36.6 C)  TempSrc:  Oral Oral Oral  Resp:  18 18 18   Weight: 83.008 kg (183 lb)     SpO2:  100% 99% 100%      Intake/Output Summary (Last 24 hours) at 05/04/14 1056 Last data filed at 05/04/14 0950  Gross per 24 hour  Intake 4455.83 ml  Output   3050 ml  Net 1405.83 ml         Recent Labs  05/03/14 1200 05/04/14 0525  WBC 8.4 10.7*  HGB 11.5* 9.3*  HCT 32.5* 27.1*  PLT 232 203     Blood type: A POS (10/18 1200)  Rubella: Immune (05/06 0000)     Physical Exam:  General: alert, cooperative and no distress CV: Regular rate and rhythm, S1S2 present or without murmur or extra heart sounds Resp: clear Abdomen: soft, nontender, normal bowel sounds Incision: Tegaderm adn Honeycomb C/D/I - skin well-approximated with staples Uterine Fundus: firm, 1 FB below umbilicus, nontender Lochia: minimal Ext: extremities normal, atraumatic, no cyanosis or edema, Homans sign is negative, no sign of DVT and no edema, redness or tenderness in the calves or thighs   Assessment/Plan: 28 y.o.   POD# 1.  s/p Cesarean Delivery.  Indications: placenta previa and vaginal  bleeding in third trimester                 Principal Problem:   Postpartum care following cesarean delivery (10/18) Active Problems:   Placenta previa with hemorrhage in third trimester   Premature birth   S/P bilateral tubal ligation (10/18)   Acute blood loss anemia  Doing well, stable.               Regular diet as tolerated D/C saline lock per protocol Continue FeSO4 BID Ambulate Routine post-op care  Kenard GowerAWSON, Yuki Brunsman, M, MSN, CNM 05/04/2014, 10:56 AM

## 2014-05-04 NOTE — Addendum Note (Signed)
Addendum created 05/04/14 0748 by Earmon PhoenixValerie P Login Muckleroy, CRNA   Modules edited: Notes Section   Notes Section:  File: [id number]

## 2014-05-04 NOTE — Anesthesia Postprocedure Evaluation (Signed)
  Anesthesia Post-op Note  Patient: [name]  Procedure(s) Performed: Procedure(s): CESAREAN SECTION (N/A)  Patient Location: [facility]  Anesthesia Type:Spinal  Level of Consciousness: awake, alert , oriented and patient cooperative  Airway and Oxygen Therapy: Patient Spontanous Breathing  Post-op Pain: mild  Post-op Assessment: Patient's Cardiovascular Status Stable, Respiratory Function Stable, No headache, No backache, No residual numbness and No residual motor weakness  Post-op Vital Signs: Reviewed and stable  Last Vitals:  Filed Vitals:   05/04/14 0503  BP: 102/49  Pulse: 73  Temp: 36.6 C  Resp: 18    Complications: No apparent anesthesia complications

## 2014-05-05 LAB — TYPE AND SCREEN
ABO/RH(D): A POS
Antibody Screen: NEGATIVE
UNIT DIVISION: 0
Unit division: 0

## 2014-05-05 NOTE — Progress Notes (Signed)
Clinical Social Work Department PSYCHOSOCIAL ASSESSMENT - MATERNAL/CHILD 05/05/2014  Patient:  [name],[name]  Account Number:  [id number]  [place] Date:  05/03/2014  Ardine Eng Name:   Johnathan Hausen    Clinical Social Worker:  Lucita Ferrara, CLINICAL SOCIAL WORKER   Date/Time:  05/05/2014 10:30 AM  Date Referred:     Referral source  NICU     Referred reason  NICU   Other referral source:    I:  FAMILY / [place] legal guardian:  PARENT  Guardian - Name Guardian - Age Guardian - Address  [name] [address] [place], [place] [address]  [name]  same as above   Other household support members/support persons Name Relationship DOB  [name] 28 year old   Other support:   MOB stated that her mother lives right next door and is their primary support person.  She reported belief that they are well supported by family and friends.    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  FOB shared that he is employed and feels well supported by his employer.   Financial resources:  Multimedia programmer If [place]:    School / Grade:  N/A Music therapist / Child Services Coordination / Early Interventions:   N/A  Cultural issues impacting care:   None reported.    III  STRENGTHS Strengths  Home prepared for Child (including basic supplies)  Adequate Resources  Supportive family/friends   Strength comment:    IV  RISK FACTORS AND CURRENT PROBLEMS Current Problem:  YES   Risk Factor & Current Problem Patient Issue Family Issue Risk Factor / Current Problem Comment  Other - See comment Y N MOB and FOB report that they are coping well with the baby in the NICU, but are continuing to adjust.         V  SOCIAL WORK ASSESSMENT CSW met with the MOB and the FOB in the MOB's room in order to complete the assessment. CSW  introduced self, role of CSW in NICU team, and provided emotional support due to their baby being admitted to the NICU.  MOB and FOB presented as easily engaged and were receptive to the visit.  MOB displayed a full range in affect and presented in a pleasant mood.    MOB and FOB expressed appreciation for the NICU team, and stated that they believe that the baby is in good care.  The MOB and FOB shard belief that they are "coping well" with the NICU admission, and MOB stated that she had slowly been preparing herself for the NICU since she anticipated it because of her pregnancy complications.  MOB expressed gratitude that he was able to make it to 36 weeks, and that he is alive.  She stated that she has had 2 friends recently who had fetal demises/miscarriages, and she considers herself lucky to have the baby in the NICU.  She stated that she continues to adjust to the changes in her experiences related to the pregnancy and delivery in comparison to her daughter, since her previous pregnancy was "text book" and "perfect".  CSW explored additional feelings that are common with a NICU admission.  MOB stated that she tends to "compartamentalize" her feelings, and stated that it is often difficult for her to "in the moment" process how she feels. She stated that she tends to not talk  about her feelings or realize what she has been through until "weeks after it passes".  She acknowledged the importance of talking about her feelings related to the NICU admission, and stated that she feels comfortable talking to the FOB about her feelings.   MOB presented with awareness of postpartum depression.  She denied postpartum depression after her first daughter was born.  She reported history of anxiety/depression, but denied any recent symptoms or treatment since it occurred "years ago".  MOB was receptive to education on signs/symptoms, and was agreeable to contacting her MD if she experiences symptoms.   CSW did not  identify psychosocial stressors that warrant further intervention.  VI SOCIAL WORK PLAN Social Work Therapist, art  No Further Intervention Required / No Barriers to Discharge   Type of pt/family education:   Postparutm depression   If child protective services report - county:   If child protective services report - date:   Information/referral to community resources comment:   No referrals needed at this time.   Other social work plan:   CSW to continue to provide emotional support PRN.

## 2014-05-05 NOTE — Lactation Note (Signed)
This note was copied from the chart of [name]. Lactation Consultation Note  Follow up visit made.  Mom states pumping is going well and breasts feel fuller today.  She is obtaining small amounts of transitional milk each pumping.  No questions or concerns at present.  Encouraged to call for concerns/assist prn.  Patient Name: Teresa Mila Homermily Yutzy ZOXWR'UToday's Date: 05/05/2014     Maternal Data    Feeding    LATCH Score/Interventions                      Lactation Tools Discussed/Used     Consult Status      Huston FoleyMOULDEN, Lenda Baratta S 05/05/2014, 6:10 PM

## 2014-05-05 NOTE — Progress Notes (Signed)
POD # 2  Subjective: Pt reports feeling well/ Pain controlled with Motrin and Percocet Tolerating po/Voiding without problems/ No n/v/ Flatus present Activity: ad lib Bleeding is light Newborn info:  Information for the patient's newborn:  Rodney BoozeSimmons, Boy Albertha [[id number]][[id number]]  female  / Circumcision: NICU/ Feeding: breastpumping   Objective: VS:  Filed Vitals:   05/04/14 1546 05/04/14 1803 05/04/14 2102 05/05/14 0605  BP:  110/62 114/67 100/51  Pulse:  76 86 77  Temp:  98 F (36.7 C) 98.8 F (37.1 C) 98.3 F (36.8 C)  TempSrc:  Oral Oral Oral  Resp:  18 18 18   Height: 5\' 5"  (1.651 m)     Weight: 83.008 kg (183 lb)     SpO2:  100% 98% 100%    I&O: Intake/Output     10/19 0701 - 10/20 0700 10/20 0701 - 10/21 0700   P.O. 1560    I.V. (mL/kg) 795.8 (9.6)    Total Intake(mL/kg) 2355.8 (28.4)    Urine (mL/kg/hr) 1050 (0.5)    Blood     Total Output 1050     Net +1305.8            LABS:  Recent Labs  05/03/14 1200 05/04/14 0525  WBC 8.4 10.7*  HGB 11.5* 9.3*  HCT 32.5* 27.1*  PLT 232 203    Blood type: --/--/A POS (10/18 1200) Rubella: Immune (05/06 0000)     Physical Exam:  General: alert and cooperative CV: Regular rate and rhythm Resp: CTA bilaterally Abdomen: soft, nontender, normal bowel sounds Uterine Fundus: firm, below umbilicus, nontender Incision: Covered with Tegaderm and honeycomb dressing; no significant drainage, edema, bruising, or erythema; well approximated with suture. Lochia: minimal Ext: extremities normal, atraumatic, no cyanosis or edema and Homans sign is negative, no sign of DVT    Assessment/: POD # 2/ G2P1102/ S/P C/Section d/t previa ABL anemia Doing well  Plan: Continue routine post op orders Anticipate discharge home tomorrow Influenza and Tdap already administered in office   Signed: Donette LarryBHAMBRI, Jannette Cotham, N, MSN, CNM 05/05/2014, 9:52 AM

## 2014-05-06 MED ORDER — OXYCODONE-ACETAMINOPHEN 5-325 MG PO TABS: 1.0000 | ORAL_TABLET | ORAL | Status: AC | PRN

## 2014-05-06 MED ORDER — IBUPROFEN 600 MG PO TABS: 600.0000 mg | ORAL_TABLET | Freq: Four times a day (QID) | ORAL | Status: AC

## 2014-05-06 NOTE — Progress Notes (Signed)
POSTOPERATIVE DAY # 3 S/P CS - previa with acute bleed  Not in room - will return to see patient at lunch for exam and DC planning   O:  VS: BP 105/50  Pulse 66  Temp(Src) 98.4 F (36.9 C) (Oral)  Resp 18  Ht 5\' 5"  (1.651 m)  Wt 83.008 kg (183 lb)  BMI 30.45 kg/m2  SpO2 100%  Breastfeeding? Unknown   LABS:               Recent Labs  05/03/14 1200 05/04/14 0525  WBC 8.4 10.7*  HGB 11.5* 9.3*  PLT 232 203               Bloodtype: --/--/A POS (10/18 1200)  Rubella: Immune (05/06 0000)                                           A:        POD # 3 S/P CS  P:        Routine postoperative care              Anticipate DC today - newborn in NICU (late preterm at 5636 weeks)    Marlinda MikeBAILEY, Zayed Griffie CNM, MSN, [facility] 05/06/2014, 10:02 AM

## 2014-05-06 NOTE — Progress Notes (Signed)
I met family at their baby's bedside in the NICU.  They were in good spirits.  Pt is anxious to get home to be with her other child, whom she has been separated from, and she is also uplifted by the good reports that they are getting about their baby's progress.  We will continue to be a support to them, but please page as needs arise.  [place] Pager, [phone] 1:59 PM   05/06/14 1300  Clinical Encounter Type  Visited With Patient;Family  Visit Type Initial

## 2014-05-06 NOTE — Lactation Note (Signed)
This note was copied from the chart of Teresa Mila Homermily [place]. Lactation Consultation Note  Follow up visit.  Mom is pumping 20-40 mls from each breast.  Fullness but no engorgement.  No concerns at present.  Baby is currently on the ventilator and NPO.  Reassured mom that when baby is ready to go to the breast we will be available for assist and support.  Encouraged to call with concerns prn.  Patient Name: [name] GNFAO'ZToday's Date: 05/06/2014     Maternal Data    Feeding    LATCH Score/Interventions                      Lactation Tools Discussed/Used     Consult Status      Huston FoleyMOULDEN, Lilac Hoff S 05/06/2014, 11:03 AM

## 2014-05-06 NOTE — Lactation Note (Signed)
This note was copied from the chart of [name]. Lactation Consultation Note  Mom requested to see me again this AM.  She noticed a very small amount of blood in her milk after the last pumping.  Both nipples cracked and bleeding.  Recommended mom change to the 27 mm flanges, use coconut oil to the inside of flange, decrease suction and use comfort gels as directed.  Reassured that milk is safe for baby.  Patient Name: Teresa Mila Homermily Mannan ZOXWR'UToday's Date: 05/06/2014     Maternal Data    Feeding    LATCH Score/Interventions                      Lactation Tools Discussed/Used     Consult Status      Huston FoleyMOULDEN, Montie Swiderski S 05/06/2014, 11:58 AM

## 2014-05-06 NOTE — Progress Notes (Signed)
POSTOPERATIVE DAY # 3 S/P CS  S:         Reports feeling well             Tolerating po intake / no nausea / no vomiting / + flatus / + BM             Bleeding is spotting             Pain controlled with motrin and percocet             Up ad lib / ambulatory/ voiding QS  Newborn breast feeding  / newborn in NICU - late preterm admitted with grunting / retractions  O:  VS: BP 135/94  Pulse 69  Temp(Src) 98.2 F (36.8 C) (Oral)  Resp 18  Ht 5\' 5"  (1.651 m)  Wt 183 lb (83.008 kg)  BMI 30.45 kg/m2  SpO2 100%  Breastfeeding? Unknown   LABS:               Recent Labs  05/04/14 0525  WBC 10.7*  HGB 9.3*  PLT 203               Bloodtype: --/--/A POS (10/18 1200)  Rubella: Immune (05/06 0000)                                 Physical Exam:             Alert and Oriented X3  Abdomen: soft, non-tender, non-distended             Fundus: firm, non-tender, U-2             Dressing intact honeycomb              Incision:  approximated with staples / no erythema / no ecchymosis / no drainage  Perineum: intact  Lochia: light spotting  Extremities: no edema, no calf pain or tenderness, neg Homans  A:        POD # 3 S/P CS            ABL anemia  P:        Routine postoperative care              [facility]    Marlinda MikeBAILEY, Saivon Prowse CNM, MSN, [facility]ACNM 05/06/2014, 1:21 PM

## 2014-05-06 NOTE — Discharge Summary (Signed)
POSTOPERATIVE DISCHARGE SUMMARY:  Patient ID: [name] MRN: [id number] DOB/AGE: Jan 05, 1986 28 y.o.  Admit date: 05/03/2014 Admission Diagnoses: 36.0 weeks / previa / active bleeding  Discharge date:  05/06/2014 Discharge Diagnoses: POD 3 s/p primary cesarean section   Prenatal history: [id number]   EDC : 05/31/2014, by Other Basis  [facility]  Primary provider : Mody Prenatal course complicated by previa  Prenatal Labs: ABO, Rh: --/--/A POS (10/18 1200)  Antibody: NEG (10/18 1200) Rubella: Immune (05/06 0000)   RPR: Nonreactive (05/06 0000)  HBsAg: Negative (05/06 0000)  HIV: Non-reactive (05/06 0000)  GTT : NL GBS:   neg  Medical / Surgical History :  Past medical history:  Past Medical History  Diagnosis Date  . Postpartum care following vaginal delivery (10/7) 04/23/2013  . Placenta previa   . Premature birth 05/04/2014  . S/P bilateral tubal ligation 05/04/2014  . Acute blood loss anemia 05/04/2014    Past surgical history:  Past Surgical History  Procedure Laterality Date  . Muscle repair      right groin  . Cesarean section N/A 05/03/2014    Procedure: CESAREAN SECTION;  Surgeon: Serita KyleSheronette A Ivette Castronova, MD;  Location: [facility];  Service: Obstetrics;  Laterality: N/A;    Family History:  Family History  Problem Relation Age of Onset  . Cancer Mother   . Cancer Paternal Aunt   . Cancer Maternal Grandmother   . Cancer Paternal Grandmother     Social History:  reports that she has quit smoking. Her smoking use included Cigarettes. She has a 1.5 pack-year smoking history. She has never used smokeless tobacco. She reports that she does not drink alcohol or use illicit drugs.  Allergies: Review of patient's allergies indicates no known allergies.   Current Medications at time of admission:  Prior to Admission medications   Medication Sig Start Date End Date Taking? Authorizing Provider  cyclobenzaprine (FLEXERIL) 5 MG  tablet Take 1 tablet (5 mg total) by mouth once. 04/03/14  Yes Robley FriesVaishali R Mody, MD  docusate sodium 100 MG CAPS Take 100 mg by mouth 2 (two) times daily. 04/03/14  Yes Robley FriesVaishali R Mody, MD  ferrous sulfate 325 (65 FE) MG tablet Take 325 mg by mouth daily.   Yes Historical Provider, MD  Prenatal Vit-Fe Fumarate-FA (PRENATAL MULTIVITAMIN) TABS tablet Take 1 tablet by mouth daily.    Yes Historical Provider, MD  ibuprofen (ADVIL,MOTRIN) 600 MG tablet Take 1 tablet (600 mg total) by mouth every 6 (six) hours. 05/06/14   Marlinda Mikeanya Bailey, CNM  oxyCODONE-acetaminophen (PERCOCET/ROXICET) 5-325 MG per tablet Take 1 tablet by mouth every 4 (four) hours as needed (for pain scale less than 7). 05/06/14   Marlinda Mikeanya Bailey, CNM    Intrapartum Course:  Admit for acute bleeding episode # 3 with known previa @ 36 weeks Interventions required: cesarean delivery  Procedures: Cesarean section delivery on 05/04/2014 with delivery of  Viable female newborn by Dr Cherly Hensenousins   See operative report for further details APGAR (1 MIN): 5   APGAR (5 MINS): 8    Baby in NICU for respiratory issues  Postoperative / postpartum course:  Uncomplicated with discharge on POD 3  Discharge Instructions:  Discharged Condition: stable  Activity: pelvic rest  X 6 weeks and postoperative restrictions x 2 weeks  Diet: routine  Medications:    Medication List         cyclobenzaprine 5 MG tablet  Commonly known as:  FLEXERIL  Take 1  tablet (5 mg total) by mouth once.     DSS 100 MG Caps  Take 100 mg by mouth 2 (two) times daily.     ferrous sulfate 325 (65 FE) MG tablet  Take 325 mg by mouth daily.     ibuprofen 600 MG tablet  Commonly known as:  ADVIL,MOTRIN  Take 1 tablet (600 mg total) by mouth every 6 (six) hours.     oxyCODONE-acetaminophen 5-325 MG per tablet  Commonly known as:  PERCOCET/ROXICET  Take 1 tablet by mouth every 4 (four) hours as needed (for pain scale less than 7).     prenatal multivitamin Tabs  tablet  Take 1 tablet by mouth daily.        Wound Care: keep clean and dry / [facility] Instructions: [facility] discharge booklet - instructions reviewed  Discharge to: Home  Follow up :  [facility] in 2 days for interval visit with nurse for staple removal [facility] in 6 weeks for routine postpartum visit with Dr Juliene PinaMody                Signed: Marlinda MikeBAILEY, TANYA CNM, MSN, [facility] 05/06/2014, 1:25 PM

## 2014-05-18 ENCOUNTER — Encounter (HOSPITAL_COMMUNITY): Payer: Self-pay | Admitting: Obstetrics and Gynecology
